# Patient Record
Sex: Male | Born: 1980 | Race: White | Hispanic: No | Marital: Married | Smoking: Former smoker
Health system: Southern US, Community
[De-identification: ages and names within clinical notes are randomized; demographics above are authoritative.]

## PROBLEM LIST (undated history)

## (undated) DIAGNOSIS — G5600 Carpal tunnel syndrome, unspecified upper limb: Secondary | ICD-10-CM

## (undated) DIAGNOSIS — K4090 Unilateral inguinal hernia, without obstruction or gangrene, not specified as recurrent: Secondary | ICD-10-CM

## (undated) DIAGNOSIS — B009 Herpesviral infection, unspecified: Secondary | ICD-10-CM

## (undated) DIAGNOSIS — M109 Gout, unspecified: Secondary | ICD-10-CM

## (undated) HISTORY — PX: CARPAL TUNNEL RELEASE: SHX101

## (undated) HISTORY — DX: Carpal tunnel syndrome, unspecified upper limb: G56.00

## (undated) HISTORY — PX: HERNIA REPAIR: SHX51

## (undated) HISTORY — PX: TOE SURGERY: SHX1073

## (undated) HISTORY — PX: OTHER SURGICAL HISTORY: SHX169

## (undated) HISTORY — DX: Herpesviral infection, unspecified: B00.9

---
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
## 2001-01-06 ENCOUNTER — Emergency Department (HOSPITAL_COMMUNITY): Admission: EM | Admit: 2001-01-06 | Discharge: 2001-01-06 | Payer: Self-pay | Admitting: Emergency Medicine

## 2005-02-13 ENCOUNTER — Emergency Department (HOSPITAL_COMMUNITY): Admission: EM | Admit: 2005-02-13 | Discharge: 2005-02-13 | Payer: Self-pay | Admitting: Emergency Medicine

## 2010-11-08 ENCOUNTER — Ambulatory Visit (HOSPITAL_COMMUNITY)
Admission: RE | Admit: 2010-11-08 | Discharge: 2010-11-08 | Disposition: A | Discharge status: Home / Self Care | Payer: BC Managed Care – PPO | Source: Ambulatory Visit | Attending: Orthopedic Surgery | Admitting: Orthopedic Surgery

## 2010-11-08 ENCOUNTER — Other Ambulatory Visit (HOSPITAL_COMMUNITY): Payer: Self-pay | Admitting: Orthopedic Surgery

## 2010-11-08 DIAGNOSIS — M79602 Pain in left arm: Secondary | ICD-10-CM

## 2010-11-08 DIAGNOSIS — Z1389 Encounter for screening for other disorder: Secondary | ICD-10-CM | POA: Insufficient documentation

## 2011-05-06 ENCOUNTER — Encounter (INDEPENDENT_AMBULATORY_CARE_PROVIDER_SITE_OTHER): Payer: Self-pay | Admitting: General Surgery

## 2011-05-09 ENCOUNTER — Ambulatory Visit (INDEPENDENT_AMBULATORY_CARE_PROVIDER_SITE_OTHER): Payer: BC Managed Care – PPO | Admitting: General Surgery

## 2011-05-20 ENCOUNTER — Encounter (INDEPENDENT_AMBULATORY_CARE_PROVIDER_SITE_OTHER): Payer: Self-pay | Admitting: General Surgery

## 2011-06-13 ENCOUNTER — Ambulatory Visit (INDEPENDENT_AMBULATORY_CARE_PROVIDER_SITE_OTHER): Payer: BC Managed Care – PPO | Admitting: General Surgery

## 2011-06-29 ENCOUNTER — Encounter (INDEPENDENT_AMBULATORY_CARE_PROVIDER_SITE_OTHER): Payer: Self-pay | Admitting: General Surgery

## 2011-11-15 ENCOUNTER — Emergency Department (HOSPITAL_COMMUNITY): Payer: BC Managed Care – PPO

## 2011-11-15 ENCOUNTER — Emergency Department (HOSPITAL_COMMUNITY)
Admission: EM | Admit: 2011-11-15 | Discharge: 2011-11-15 | Disposition: A | Discharge status: Home / Self Care | Payer: BC Managed Care – PPO | Attending: Emergency Medicine | Admitting: Emergency Medicine

## 2011-11-15 ENCOUNTER — Encounter (HOSPITAL_COMMUNITY): Payer: Self-pay | Admitting: *Deleted

## 2011-11-15 DIAGNOSIS — M25469 Effusion, unspecified knee: Secondary | ICD-10-CM | POA: Insufficient documentation

## 2011-11-15 DIAGNOSIS — M25561 Pain in right knee: Secondary | ICD-10-CM

## 2011-11-15 MED ORDER — HYDROCODONE-ACETAMINOPHEN 5-500 MG PO TABS: 1.0000 | ORAL_TABLET | Freq: Four times a day (QID) | ORAL | Status: AC | PRN

## 2011-11-15 NOTE — ED Provider Notes (Signed)
History     CSN: [id number]  Arrival date & time 11/15/11  0343   First MD Initiated Contact with Patient 11/15/11 [phone]      Chief Complaint  Patient presents with  . Knee Pain    (Consider location/radiation/quality/duration/timing/severity/associated sxs/prior treatment) HPI Comments: The patient started with right knee pain 2 days ago without a history of trauma.  He works Development worker, community down frequently.   Patient is a 31 y.o. male presenting with knee pain. The history is provided by the patient.  Knee Pain This is a new problem. The current episode started 2 days ago. The problem occurs constantly. The problem has been gradually worsening. The symptoms are aggravated by walking. Nothing relieves the symptoms. He has tried nothing for the symptoms.    History reviewed. No pertinent past medical history.  History reviewed. No pertinent past surgical history.  No family history on file.  History  Substance Use Topics  . Smoking status: Never Smoker   . Smokeless tobacco: Not on file  . Alcohol Use: Yes      Review of Systems  All other systems reviewed and are negative.    Allergies  Review of patient's allergies indicates no known allergies.  Home Medications  No current outpatient prescriptions on file.  BP 132/77  Pulse 105  Temp 98.4 F (36.9 C) (Oral)  Resp 20  SpO2 98%  Physical Exam  Nursing note and vitals reviewed. Constitutional: He is oriented to person, place, and time. He appears well-developed and well-nourished. No distress.  HENT:  Head: Normocephalic and atraumatic.  Neck: Normal range of motion. Neck supple.  Musculoskeletal:       There is a palpable effusion in the right knee.  There is pain with range of motion.  There is no erythema or warmth.  Stable ap, laterally.  Neurological: He is alert and oriented to person, place, and time.  Skin: Skin is warm and dry. He is not diaphoretic.    ED Course  Procedures  (including critical care time)  Labs Reviewed - No data to display Dg Knee Complete 4 Views Right  11/15/2011  *RADIOLOGY REPORT*  Clinical Data: Knee pain and cramping.  RIGHT KNEE - COMPLETE 4+ VIEW  Comparison: None.  Findings: Moderate sized right knee effusion.  No evidence of acute fracture or subluxation.  No focal bone lesion or bone destruction. Bone cortex and trabecular architecture appear intact. No radiopaque soft tissue foreign bodies.  IMPRESSION: Moderate sized right knee effusion.  No acute bony abnormalities.  Original Report Authenticated By: Marlon Pel, M.D.     No diagnosis found.    MDM  Looks like an effusion due to inflammation from repeated movements.  Will discharge with rest, time, to follow up with his pcp if not improving in the next 1-2 weeks.        Geoffery Lyons, MD 11/15/11 [phone]

## 2011-11-15 NOTE — ED Notes (Signed)
Ortho tech to bedside and applied ace wrap and gave crutches

## 2011-11-15 NOTE — Progress Notes (Signed)
Orthopedic Tech Progress Note Patient Details:  Zachary Mason [birth year] [id number]  Ortho Devices Type of Ortho Device: Crutches Ortho Device/Splint Interventions: Application   Asia Burnett Kanaris 11/15/2011, 5:43 AM

## 2011-11-15 NOTE — ED Notes (Addendum)
C/o R knee pain, 8/10, felt a cramping 2 weeks ago, but noticed pain and swelling 2d ago. Swelling noted. No known injury. CMS intact BLE. Denies fever.

## 2012-02-03 ENCOUNTER — Ambulatory Visit: Payer: BC Managed Care – PPO | Admitting: Internal Medicine

## 2012-02-03 DIAGNOSIS — Z0289 Encounter for other administrative examinations: Secondary | ICD-10-CM

## 2012-02-29 ENCOUNTER — Emergency Department (HOSPITAL_COMMUNITY)
Admission: EM | Admit: 2012-02-29 | Discharge: 2012-02-29 | Disposition: A | Discharge status: Home / Self Care | Payer: BC Managed Care – PPO | Attending: Emergency Medicine | Admitting: Emergency Medicine

## 2012-02-29 ENCOUNTER — Emergency Department (HOSPITAL_COMMUNITY): Payer: BC Managed Care – PPO

## 2012-02-29 ENCOUNTER — Encounter (HOSPITAL_COMMUNITY): Payer: Self-pay | Admitting: Emergency Medicine

## 2012-02-29 DIAGNOSIS — K4090 Unilateral inguinal hernia, without obstruction or gangrene, not specified as recurrent: Secondary | ICD-10-CM | POA: Insufficient documentation

## 2012-02-29 DIAGNOSIS — Z862 Personal history of diseases of the blood and blood-forming organs and certain disorders involving the immune mechanism: Secondary | ICD-10-CM | POA: Insufficient documentation

## 2012-02-29 DIAGNOSIS — Z8639 Personal history of other endocrine, nutritional and metabolic disease: Secondary | ICD-10-CM | POA: Insufficient documentation

## 2012-02-29 DIAGNOSIS — R109 Unspecified abdominal pain: Secondary | ICD-10-CM | POA: Insufficient documentation

## 2012-02-29 HISTORY — DX: Unilateral inguinal hernia, without obstruction or gangrene, not specified as recurrent: K40.90

## 2012-02-29 HISTORY — DX: Gout, unspecified: M10.9

## 2012-02-29 MED ORDER — IBUPROFEN 600 MG PO TABS: 600.0000 mg | ORAL_TABLET | Freq: Four times a day (QID) | ORAL | Status: DC | PRN

## 2012-02-29 MED ORDER — TRAMADOL HCL 50 MG PO TABS: 50.0000 mg | ORAL_TABLET | Freq: Four times a day (QID) | ORAL | Status: DC | PRN

## 2012-02-29 MED ORDER — IBUPROFEN 800 MG PO TABS
800.0000 mg | ORAL_TABLET | Freq: Once | ORAL | Status: AC
  Administered 2012-02-29: 800 mg via ORAL
  Filled 2012-02-29: qty 1

## 2012-02-29 NOTE — ED Provider Notes (Signed)
Medical screening examination/treatment/procedure(s) were performed by non-physician practitioner and as supervising physician I was immediately available for consultation/collaboration.  Gerhard Munch, MD 02/29/12 [phone]

## 2012-02-29 NOTE — ED Notes (Signed)
Pt sts hx of right inguinal hernia that is painful at present; pt denies feeling it being more firm than normal

## 2012-02-29 NOTE — ED Provider Notes (Signed)
History     CSN: [id number]  Arrival date & time 02/29/12  1059   First MD Initiated Contact with Patient 02/29/12 1149      Chief Complaint  Patient presents with  . Hernia    (Consider location/radiation/quality/duration/timing/severity/associated sxs/prior treatment) HPI Comments: Zachary Mason is a 31 y.o. Male who presents with complaint of a right inguinal hernia. State hernia there for "years." states more painful in the last several days. Stats pain is worse with straining and heavy lifting. Denies n/v fever. Normal bowel movement this morning. No urinary symptoms. No other complaints. Stats had it looked at once a long time ago, states has not followed up since.    Past Medical History  Diagnosis Date  . Gout   . Inguinal hernia     History reviewed. No pertinent past surgical history.  History reviewed. No pertinent family history.  History  Substance Use Topics  . Smoking status: Never Smoker   . Smokeless tobacco: Not on file  . Alcohol Use: Yes      Review of Systems  Constitutional: Negative for fever and chills.  HENT: Negative for neck pain and neck stiffness.   Respiratory: Negative.   Cardiovascular: Negative.   Gastrointestinal: Positive for abdominal pain. Negative for nausea, vomiting, diarrhea, constipation and blood in stool.  Genitourinary: Negative for dysuria and flank pain.  Musculoskeletal: Negative.   Skin: Negative.   Neurological: Negative for dizziness and weakness.  Psychiatric/Behavioral: Negative.     Allergies  Review of patient's allergies indicates no known allergies.  Home Medications  No current outpatient prescriptions on file.  BP 124/82  Pulse 61  Temp 97.6 F (36.4 C) (Oral)  Resp 18  SpO2 100%  Physical Exam  Nursing note and vitals reviewed. Constitutional: He appears well-developed and well-nourished. No distress.  HENT:  Head: Normocephalic and atraumatic.  Eyes: Conjunctivae normal are normal.    Cardiovascular: Normal rate, regular rhythm and normal heart sounds.   Pulmonary/Chest: Effort normal and breath sounds normal. No respiratory distress. He has no wheezes. He has no rales.  Abdominal: Soft. Bowel sounds are normal. He exhibits no distension. There is no tenderness. There is no rebound.       About 8cm soft, reducible right inguinal direct hernia. Non tender.   Musculoskeletal: He exhibits no edema.  Neurological: He is alert.  Skin: Skin is warm and dry.    ED Course  Procedures (including critical care time)  No results found for this or any previous visit. Dg Abd 2 Views  02/29/2012  *RADIOLOGY REPORT*  Clinical Data: Right inguinal hernia with worsening pain  ABDOMEN - 2 VIEW  Comparison: None.  Findings: Bowel gas pattern is normal without evidence of ileus, obstruction or free air.  No abnormal calcifications or bony findings.  IMPRESSION: Normal radiographs.   Original Report Authenticated By: Thomasenia Sales, M.D.       1. Right inguinal hernia       MDM  Reducible, right inguinal hernia. No signs of obstruction, incarceration, strangulation. Pt has no n/v. Normal BM today. Pt will be referred to surgery for further treatment.   Filed Vitals:   02/29/12 1321  BP: 138/95  Pulse: 64  Temp:   Resp: 16           Loranda Mastel A Tabytha Gradillas, PA 02/29/12 1535

## 2012-03-13 ENCOUNTER — Ambulatory Visit (INDEPENDENT_AMBULATORY_CARE_PROVIDER_SITE_OTHER): Payer: BC Managed Care – PPO | Admitting: Surgery

## 2012-03-13 ENCOUNTER — Encounter (INDEPENDENT_AMBULATORY_CARE_PROVIDER_SITE_OTHER): Payer: Self-pay | Admitting: Surgery

## 2012-03-13 VITALS — BP 142/70 | HR 68 | Temp 97.5°F | Resp 20 | Ht 68.0 in | Wt 184.2 lb

## 2012-03-13 DIAGNOSIS — K4090 Unilateral inguinal hernia, without obstruction or gangrene, not specified as recurrent: Secondary | ICD-10-CM

## 2012-03-13 NOTE — Progress Notes (Signed)
Patient ID: Zachary Mason, male   DOB: Nov 16, 1980, 31 y.o.   MRN: [id number]  Chief Complaint  Patient presents with  . New Evaluation    eval RIH    HPI Zachary Mason is a 31 y.o. male.  Referred by Dr. Jeraldine Loots - ED HPI This is a 31 year old male who presents with a history over the last year of an enlarging bulge in his right groin. Recently this has become very uncomfortable. He remains reducible. The pain radiates across his lower pelvis towards the left side. Sometimes it goes down into his testicle as well as into his thigh. Sometimes his pain is quite severe.  Past Medical History  Diagnosis Date  . Gout   . Inguinal hernia     History reviewed. No pertinent past surgical history.  Family History  Problem Relation Age of Onset  . Cancer Maternal Grandmother     breast    Social History History  Substance Use Topics  . Smoking status: Former Games developer  . Smokeless tobacco: Never Used  . Alcohol Use: No    No Known Allergies  Current Outpatient Prescriptions  Medication Sig Dispense Refill  . ibuprofen (ADVIL,MOTRIN) 600 MG tablet Take 1 tablet (600 mg total) by mouth every 6 (six) hours as needed for pain.  30 tablet  0  . traMADol (ULTRAM) 50 MG tablet Take 1 tablet (50 mg total) by mouth every 6 (six) hours as needed for pain.  15 tablet  0    Review of Systems Review of Systems  Constitutional: Negative for fever, chills and unexpected weight change.  HENT: Negative for hearing loss, congestion, sore throat, trouble swallowing and voice change.   Eyes: Negative for visual disturbance.  Respiratory: Negative for cough and wheezing.   Cardiovascular: Negative for chest pain, palpitations and leg swelling.  Gastrointestinal: Positive for abdominal pain. Negative for nausea, vomiting, diarrhea, constipation, blood in stool, abdominal distention, anal bleeding and rectal pain.  Genitourinary: Negative for hematuria and difficulty urinating.  Musculoskeletal:  Negative for arthralgias.  Skin: Negative for rash and wound.  Neurological: Negative for seizures, syncope, weakness and headaches.  Hematological: Negative for adenopathy. Does not bruise/bleed easily.  Psychiatric/Behavioral: Negative for confusion.    Blood pressure 142/70, pulse 68, temperature 97.5 F (36.4 C), temperature source Temporal, resp. rate 20, height 5\' 8"  (1.727 m), weight 184 lb 3.2 oz (83.553 kg).  Physical Exam Physical Exam WDWN in NAD HEENT:  EOMI, sclera anicteric Neck:  No masses, no thyromegaly Lungs:  CTA bilaterally; normal respiratory effort CV:  Regular rate and rhythm; no murmurs Abd:  +bowel sounds, soft, non-tender, no masses GU:  Bilateral descended testes; no testicular masses; visible right inguinal bulge; no sign of left inguinal hernia Ext:  Well-perfused; no edema Skin:  Warm, dry; no sign of jaundice  Data Reviewed none  Assessment    Right inguinal hernia  - reducible    Plan    Right inguinal hernia repair with mesh.  The surgical procedure has been discussed with the patient.  Potential risks, benefits, alternative treatments, and expected outcomes have been explained.  All of the patient's questions at this time have been answered.  The likelihood of reaching the patient's treatment goal is good.  The patient understand the proposed surgical procedure and wishes to proceed.        Lamario Mani K. 03/13/2012, 3:53 PM

## 2012-04-04 DIAGNOSIS — K4090 Unilateral inguinal hernia, without obstruction or gangrene, not specified as recurrent: Secondary | ICD-10-CM

## 2012-04-05 ENCOUNTER — Telehealth (INDEPENDENT_AMBULATORY_CARE_PROVIDER_SITE_OTHER): Payer: Self-pay | Admitting: General Surgery

## 2012-04-05 NOTE — Telephone Encounter (Signed)
Pt called to report unresolved post op pain; could not rest at all.  Offered suggestions to help his achieve better pain control, but taking 2 Oxycodone Q4H x 12-24 hours.  Also to take Aleve 2 pills Q12H.  Reminded him to drink extra fluids, walk some and try taking a stool softener, as the pain meds can be constipating.  Use an ice pack to the surgical site when sitting.  He will try all this and call back if not better.

## 2012-04-09 ENCOUNTER — Telehealth (INDEPENDENT_AMBULATORY_CARE_PROVIDER_SITE_OTHER): Payer: Self-pay | Admitting: General Surgery

## 2012-04-09 NOTE — Telephone Encounter (Signed)
Pt called for refill of pain meds; informed he will get Vicodin this time.  Called Hydrocodone 5/325 mg,  # 30, 1-2 po Q 4-6 H prn pain, no refill to [facility]:  [phone].

## 2012-04-19 ENCOUNTER — Encounter (INDEPENDENT_AMBULATORY_CARE_PROVIDER_SITE_OTHER): Payer: BC Managed Care – PPO | Admitting: Surgery

## 2012-05-08 ENCOUNTER — Encounter (INDEPENDENT_AMBULATORY_CARE_PROVIDER_SITE_OTHER): Payer: Self-pay | Admitting: Surgery

## 2013-09-20 ENCOUNTER — Encounter: Payer: Self-pay | Admitting: Podiatry

## 2013-09-20 ENCOUNTER — Ambulatory Visit (INDEPENDENT_AMBULATORY_CARE_PROVIDER_SITE_OTHER): Payer: BC Managed Care – PPO | Admitting: Podiatry

## 2013-09-20 VITALS — BP 119/80 | HR 55 | Resp 18

## 2013-09-20 DIAGNOSIS — M2040 Other hammer toe(s) (acquired), unspecified foot: Secondary | ICD-10-CM

## 2013-09-20 DIAGNOSIS — L84 Corns and callosities: Secondary | ICD-10-CM

## 2013-09-20 NOTE — Progress Notes (Signed)
   Subjective:    Patient ID: Zachary Mason, male    DOB: January 05, 1981, 33 y.o.   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  Patient works as an Journalist, newspaper for multiple years  Review of Systems  Constitutional: Positive for activity change and fatigue.  Musculoskeletal: Positive for gait problem.  Neurological: Positive for numbness.       Hands and feet  Psychiatric/Behavioral: Positive for behavioral problems and confusion. The patient is nervous/anxious.   All other systems reviewed and are negative.      Objective:   Physical Exam  Orientated x3 white male  Vascular: DP 0/4 bilaterally PTs 2/4 bilaterally Capillary fill is immediate bilaterally Feet are warm to touch bilaterally  Neurological: Sensation to 10 g monofilament wire intact 5/5 bilaterally Ankle reflexes reactive bilaterally Vibratory sensation intact bilaterally  Dermatological: Keratoses noted distal medial fifth right toe (this is the area of maximum tenderness to palpation) Minimal keratoses lateral border fourth right toe  Musculoskeletal: Adductovarus fifth digits bilaterally      Assessment & Plan:   Assessment: Hammertoe fifth digit bilaterally (adductovarus deformities) Keratoses distal medial fifth right toe associated with varus deformity of fifth toe  Plan: Advised patient that the symptoms are related to the friction rub between the fourth and fifth toes when wearing closed shoes. As he said a short history and no conservative care I am advising conservative care at this time. The keratoses in the fifth right toe was debrided and packed with salinocaine  Upon removal of dressing fifth right toe I recommended  a gelcap sleeve to place over the fourth and fifth toe were dispensed a gelcap sleeve. Advised patient if symptoms were persistent to return. Made patient aware that if conservative care would not adequately resolve this problem surgical treatment could be an option.

## 2013-09-20 NOTE — Patient Instructions (Signed)

## 2013-09-21 ENCOUNTER — Encounter: Payer: Self-pay | Admitting: Podiatry

## 2014-07-11 ENCOUNTER — Ambulatory Visit: Payer: Self-pay | Admitting: Specialist

## 2014-08-31 NOTE — Op Note (Signed)
PATIENT NAME:  Zachary Mason, DISANO MR#:  [id number] DATE OF BIRTH:  December 19, 1980  DATE OF PROCEDURE:  07/11/2014  PREOPERATIVE DIAGNOSES: 1.  Left carpal tunnel syndrome.  2.  Painful callus, right little toe.   POSTOPERATIVE DIAGNOSES: 1.  Left carpal tunnel syndrome.  2.  Painful callus, right little toe.   PROCEDURES: 1.  Left carpal tunnel release.  2.  Condylectomy distal phalanx, right little toe.   SURGEON: Clare Gandy, M.D.   ANESTHESIA: General.   COMPLICATIONS: None.   TOURNIQUET TIME: Eighteen minutes for the left hand and 12 minutes for the right foot.   DESCRIPTION OF PROCEDURE: Two grams of Ancef were given intravenously prior to the procedure. General anesthesia was induced. The left upper extremity and the right foot were thoroughly prepped out with alcohol and ChloraPrep and draped in standard sterile fashion. At the left upper extremity, the extremity was wrapped out with the Esmarch bandage and pneumatic tourniquet elevated to 250 mmHg.  Under loupe magnification, standard volar carpal tunnel incision was made and the dissection carried down to the transverse  retinacular ligament. Incision was made in the midportion. The distal release was performed with the small scissors and the proximal release was performed with the small scissors and the carpal tunnel scissors. There was seen to be moderate compression of the median nerve directly beneath the ligament. Careful check was made both proximally and distally to ensure that complete release had been obtained. The wound was thoroughly irrigated multiple times. Skin edges were infiltrated with 0.5% plain Marcaine. The skin was closed with 4-0 nylon. A soft bulky dressing was applied and the tourniquet was released. At the right foot, the right foot and toes were thoroughly prepped with alcohol and ChloraPrep and draped in standard sterile fashion. At the medial aspect of the tip of the right little toe, there was a prominent  callus with a palpable underlying exostosis.   Elliptical excision was made excising most of the callus.  Under loupe magnification, the dissection was carried down and scar tissue and a large osteophyte was removed.  Palpation following this demonstrated no residual  protuberance. The wound was thoroughly irrigated multiple times. Digital block was  performed with 0.5% plain Xylocaine. The skin was closed with 4-0 nylon. A soft bulky dressing was applied. The tourniquet was released. The patient was returned to the recovery room in satisfactory condition having tolerated the procedure quite well.       ____________________________ Clare Gandy, MD ces:tr D: 07/11/2014 11:55:43 ET T: 07/11/2014 12:22:40 ET JOB#: [id number]  cc: Clare Gandy, MD, <Dictator> Clare Gandy MD ELECTRONICALLY SIGNED 07/12/2014 10:07

## 2014-11-12 ENCOUNTER — Emergency Department (HOSPITAL_COMMUNITY): Payer: No Typology Code available for payment source

## 2014-11-12 ENCOUNTER — Emergency Department (HOSPITAL_COMMUNITY)
Admission: EM | Admit: 2014-11-12 | Discharge: 2014-11-12 | Disposition: A | Discharge status: Home / Self Care | Payer: No Typology Code available for payment source | Attending: Emergency Medicine | Admitting: Emergency Medicine

## 2014-11-12 ENCOUNTER — Encounter (HOSPITAL_COMMUNITY): Payer: Self-pay | Admitting: Family Medicine

## 2014-11-12 DIAGNOSIS — M109 Gout, unspecified: Secondary | ICD-10-CM | POA: Insufficient documentation

## 2014-11-12 DIAGNOSIS — S29001A Unspecified injury of muscle and tendon of front wall of thorax, initial encounter: Secondary | ICD-10-CM | POA: Diagnosis present

## 2014-11-12 DIAGNOSIS — R0781 Pleurodynia: Secondary | ICD-10-CM

## 2014-11-12 DIAGNOSIS — Y998 Other external cause status: Secondary | ICD-10-CM | POA: Insufficient documentation

## 2014-11-12 DIAGNOSIS — Y9289 Other specified places as the place of occurrence of the external cause: Secondary | ICD-10-CM | POA: Insufficient documentation

## 2014-11-12 DIAGNOSIS — Z8719 Personal history of other diseases of the digestive system: Secondary | ICD-10-CM | POA: Insufficient documentation

## 2014-11-12 DIAGNOSIS — Z87891 Personal history of nicotine dependence: Secondary | ICD-10-CM | POA: Insufficient documentation

## 2014-11-12 DIAGNOSIS — S29011A Strain of muscle and tendon of front wall of thorax, initial encounter: Secondary | ICD-10-CM | POA: Insufficient documentation

## 2014-11-12 DIAGNOSIS — X58XXXA Exposure to other specified factors, initial encounter: Secondary | ICD-10-CM | POA: Insufficient documentation

## 2014-11-12 DIAGNOSIS — Y939 Activity, unspecified: Secondary | ICD-10-CM | POA: Diagnosis not present

## 2014-11-12 MED ORDER — NAPROXEN 500 MG PO TABS: 500.0000 mg | ORAL_TABLET | Freq: Two times a day (BID) | ORAL | Status: DC

## 2014-11-12 MED ORDER — KETOROLAC TROMETHAMINE 60 MG/2ML IM SOLN
60.0000 mg | Freq: Once | INTRAMUSCULAR | Status: AC
  Administered 2014-11-12: 60 mg via INTRAMUSCULAR
  Filled 2014-11-12: qty 2

## 2014-11-12 NOTE — Discharge Instructions (Signed)
Ice. Naprosyn for pain. Follow up with primary care doctor as needed. Your xray is normal today.    Chest Wall Pain Chest wall pain is pain in or around the bones and muscles of your chest. It may take up to 6 weeks to get better. It may take longer if you must stay physically active in your work and activities.  CAUSES  Chest wall pain may happen on its own. However, it may be caused by:  A viral illness like the flu.  Injury.  Coughing.  Exercise.  Arthritis.  Fibromyalgia.  Shingles. HOME CARE INSTRUCTIONS   Avoid overtiring physical activity. Try not to strain or perform activities that cause pain. This includes any activities using your chest or your abdominal and side muscles, especially if heavy weights are used.  Put ice on the sore area.  Put ice in a plastic bag.  Place a towel between your skin and the bag.  Leave the ice on for 15-20 minutes per hour while awake for the first 2 days.  Only take over-the-counter or prescription medicines for pain, discomfort, or fever as directed by your caregiver. SEEK IMMEDIATE MEDICAL CARE IF:   Your pain increases, or you are very uncomfortable.  You have a fever.  Your chest pain becomes worse.  You have new, unexplained symptoms.  You have nausea or vomiting.  You feel sweaty or lightheaded.  You have a cough with phlegm (sputum), or you cough up blood. MAKE SURE YOU:   Understand these instructions.  Will watch your condition.  Will get help right away if you are not doing well or get worse. Document Released: 04/18/2005 Document Revised: 07/11/2011 Document Reviewed: 12/13/2010 [facility] Patient Information 2015 [place], [place]. This information is not intended to replace advice given to you by your health care provider. Make sure you discuss any questions you have with your health care provider.

## 2014-11-12 NOTE — ED Provider Notes (Signed)
CSN: [id number]     Arrival date & time 11/12/14  1448 History  This chart was scribed for non-physician practitioner, Lottie Mussel, PA-C, working with Purvis Sheffield, MD by Charline Bills, ED Scribe. This patient was seen in room TR10C/TR10C and the patient's care was started at 3:56 PM.   Chief Complaint  Patient presents with  . Rib Injury   The history is provided by the patient. No language interpreter was used.   HPI Comments: Zachary Mason is a 34 y.o. male who presents to the Emergency Department complaining of left rib injury that occurred yesterday. Pt states that he was doing leg presses in the gym last night when he felt a pop in his left ribs. He reports associated non-radiating pain that is exacerbated with inhaling, palpation and movement. No treatments tried PTA. Pt denies SOB. Pt is a smoker and reports heavy lifting at work.   Past Medical History  Diagnosis Date  . Gout   . Inguinal hernia    Past Surgical History  Procedure Laterality Date  . Hernia repair     Family History  Problem Relation Age of Onset  . Cancer Maternal Grandmother     breast   History  Substance Use Topics  . Smoking status: Former Games developer  . Smokeless tobacco: Never Used  . Alcohol Use: Yes    Review of Systems  Respiratory: Negative for shortness of breath.   Musculoskeletal:       + Rib pain   Allergies  Review of patient's allergies indicates no known allergies.  Home Medications   Prior to Admission medications   Medication Sig Start Date End Date Taking? Authorizing Provider  ibuprofen (ADVIL,MOTRIN) 600 MG tablet Take 1 tablet (600 mg total) by mouth every 6 (six) hours as needed for pain. 02/29/12   Denijah Karrer, PA-C  traMADol (ULTRAM) 50 MG tablet Take 1 tablet (50 mg total) by mouth every 6 (six) hours as needed for pain. 02/29/12   Valynn Schamberger, PA-C   BP 133/80 mmHg  Pulse 78  Temp(Src) 98 F (36.7 C)  Resp 18  SpO2 97% Physical Exam   Constitutional: He is oriented to person, place, and time. He appears well-developed and well-nourished. No distress.  HENT:  Head: Normocephalic and atraumatic.  Eyes: Conjunctivae and EOM are normal.  Neck: Neck supple. No tracheal deviation present.  Cardiovascular: Normal rate, regular rhythm and normal heart sounds.   Pulmonary/Chest: Effort normal and breath sounds normal. No respiratory distress. He has no wheezes. He has no rales. He exhibits tenderness.  Tenderness to palpation over left lateral ribs, mainly over ribs 9 and 10 and intercostal spaces.  Abdominal: Soft. Bowel sounds are normal. There is no tenderness.  Musculoskeletal: Normal range of motion.  Neurological: He is alert and oriented to person, place, and time.  Skin: Skin is warm and dry.  Psychiatric: He has a normal mood and affect. His behavior is normal.  Nursing note and vitals reviewed.  ED Course  Procedures (including critical care time) DIAGNOSTIC STUDIES: Oxygen Saturation is 97% on RA, normal by my interpretation.    COORDINATION OF CARE: 3:59 PM-Discussed treatment plan which includes CXR with pt at bedside and pt agreed to plan.   Labs Review Labs Reviewed - No data to display  Imaging Review Dg Ribs Unilateral W/chest Left  11/12/2014   CLINICAL DATA:  Left side rib pain, injury at gym yesterday  EXAM: LEFT RIBS AND CHEST - 3+ VIEW  COMPARISON:  None.  FINDINGS: Four views left ribs submitted. No acute infiltrate or pulmonary edema. No left rib fracture. No pneumothorax.  IMPRESSION: Negative.   Electronically Signed   By: Natasha Mead M.D.   On: 11/12/2014 15:51    EKG Interpretation None      MDM   Final diagnoses:  Intercostal muscle strain, initial encounter    Patient with left rib pain after doing a lot depressed and possibly hitting himself in the ribs. X-rays negative. No deformity. Lungs are clear. Vital signs are normal. Most likely rib contusion versus intercostal muscle strain.  Plan to discharge home with naproxen, ice, rest. Follow up as needed.  Filed Vitals:   11/12/14 1507  BP: 133/80  Pulse: 78  Temp: 98 F (36.7 C)  Resp: 18  SpO2: 97%    I personally performed the services described in this documentation, which was scribed in my presence. The recorded information has been reviewed and is accurate.   Jaynie Crumble, PA-C 11/12/14 1615  Purvis Sheffield, MD 11/14/14 [phone]

## 2014-11-12 NOTE — ED Notes (Signed)
p here for left rib pain after doing leg presses at the gym yesterday. sts he felt a pop in left rib area.

## 2014-12-26 ENCOUNTER — Encounter: Payer: Self-pay | Admitting: Behavioral Health

## 2014-12-26 ENCOUNTER — Telehealth: Payer: Self-pay | Admitting: Behavioral Health

## 2014-12-26 NOTE — Telephone Encounter (Signed)
Pre-Visit Call completed with patient and chart updated.   Pre-Visit Info documented in Specialty Comments under [employer].    

## 2014-12-29 ENCOUNTER — Ambulatory Visit (INDEPENDENT_AMBULATORY_CARE_PROVIDER_SITE_OTHER): Payer: No Typology Code available for payment source | Admitting: Physician Assistant

## 2014-12-29 ENCOUNTER — Ambulatory Visit: Payer: Self-pay | Admitting: Physician Assistant

## 2014-12-29 ENCOUNTER — Encounter: Payer: Self-pay | Admitting: Physician Assistant

## 2014-12-29 VITALS — BP 132/88 | HR 64 | Temp 97.9°F | Resp 16 | Ht 67.0 in | Wt 189.1 lb

## 2014-12-29 DIAGNOSIS — F418 Other specified anxiety disorders: Secondary | ICD-10-CM

## 2014-12-29 DIAGNOSIS — F419 Anxiety disorder, unspecified: Principal | ICD-10-CM

## 2014-12-29 DIAGNOSIS — Z202 Contact with and (suspected) exposure to infections with a predominantly sexual mode of transmission: Secondary | ICD-10-CM | POA: Diagnosis not present

## 2014-12-29 DIAGNOSIS — Z87891 Personal history of nicotine dependence: Secondary | ICD-10-CM | POA: Insufficient documentation

## 2014-12-29 DIAGNOSIS — F32A Depression, unspecified: Secondary | ICD-10-CM | POA: Insufficient documentation

## 2014-12-29 DIAGNOSIS — A6000 Herpesviral infection of urogenital system, unspecified: Secondary | ICD-10-CM | POA: Diagnosis not present

## 2014-12-29 DIAGNOSIS — Z720 Tobacco use: Secondary | ICD-10-CM

## 2014-12-29 DIAGNOSIS — F329 Major depressive disorder, single episode, unspecified: Secondary | ICD-10-CM | POA: Insufficient documentation

## 2014-12-29 MED ORDER — VALACYCLOVIR HCL 500 MG PO TABS: ORAL_TABLET | ORAL | Status: DC

## 2014-12-29 MED ORDER — NICOTINE 21 MG/24HR TD PT24: 21.0000 mg | MEDICATED_PATCH | Freq: Every day | TRANSDERMAL | Status: DC

## 2014-12-29 MED ORDER — ESCITALOPRAM OXALATE 10 MG PO TABS: 10.0000 mg | ORAL_TABLET | Freq: Every day | ORAL | Status: DC

## 2014-12-29 MED ORDER — VALACYCLOVIR HCL 1 G PO TABS: 1000.0000 mg | ORAL_TABLET | Freq: Every day | ORAL | Status: DC

## 2014-12-29 NOTE — Assessment & Plan Note (Signed)
15-pack-year smoking history. Discussed with patient the Chantix is not a suitable option at present due to labile mood. We'll begin NicoDerm patches. Follow-up in one month.

## 2014-12-29 NOTE — Patient Instructions (Signed)
Please go to the lab for blood work. I will call you with your results.  Please start the Lexapro taking 1/2 tablet daily x 3 days. Then increase to 1 tablet daily. Give some thought to our counseling services. I think it would be very beneficial.  Please take the 500 mg tablet of Valtrex as directed for three days. Then begin the 1000 mg tablet daily for prevention. 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 Please use the Nicoderm patch as directed.  Follow-up in 4 weeks. If anything worsens, return immediately.  It was nice meeting you. Welcome to [facility].

## 2014-12-29 NOTE — Progress Notes (Signed)
Pre visit review using our clinic review tool, if applicable. No additional management support is needed unless otherwise documented below in the visit note/SLS  

## 2014-12-29 NOTE — Progress Notes (Signed)
Patient presents to clinic today to establish care.  Acute Concerns: Patient endorses generalized anxiety with severe irritability and labile mood. Also endorses depressed mood and anhedonia. Symptoms are affecting sleep. Patient has had a hard time coping with his HSV diagnosis. Has never been treated for anxiety or depression previously.  Chronic Issues: Patient with prior diagnosis of HSV 2 one year ago, obtained from his wife who he states was unfaithful. Patient with recurrent outbreaks. Endorses outbreak currently starting a day ago. Denies pain but lesions are present. Has previously been on Valtrex with good results. Is currently a 1 pack per day smoker. Is requesting routine STD panel as he states this was never checked after he tested positive for HSV. Denies penile pain or discharge. Denies unexplainable fevers changes in weight.    Past Medical History  Diagnosis Date  . Gout   . Inguinal hernia   . CTS (carpal tunnel syndrome)   . HSV-2 (herpes simplex virus 2) infection     Past Surgical History  Procedure Laterality Date  . Hernia repair    . Carpal tunnel release      Left Hand  . Bone spur      Left Foot    No current outpatient prescriptions on file prior to visit.   No current facility-administered medications on file prior to visit.    No Known Allergies  Family History  Problem Relation Age of Onset  . Breast cancer Maternal Grandmother   . Healthy Mother   . Suicidality Father     Deceased  . Hypertension Paternal Grandfather   . Hypertension Maternal Grandfather   . Diabetes Paternal Grandfather   . Diabetes Maternal Grandfather   . Heart disease Paternal Grandmother     Enlarged Heart  . Healthy Brother     x1 Half  . Healthy Sister     x2  . Healthy Daughter     x2  . Healthy Son     x1    Social History   Social History  . Marital Status: Married    Spouse Name: N/A  . Number of Children: 3  . Years of Education: N/A    Occupational History  . Not on file.   Social History Main Topics  . Smoking status: Current Every Day Smoker -- 1.00 packs/day for 15 years    Types: Cigarettes  . Smokeless tobacco: Never Used  . Alcohol Use: 0.6 oz/week    1 Standard drinks or equivalent per week  . Drug Use: Yes     Comment: marijuana -- every day use  . Sexual Activity:    Partners: Female   Other Topics Concern  . Not on file   Social History Narrative   Review of Systems  Constitutional: Negative for fever and weight loss.  HENT: Negative for hearing loss.   Respiratory: Negative for cough and shortness of breath.   Cardiovascular: Negative for chest pain and palpitations.  Neurological: Negative for dizziness, loss of consciousness and headaches.  Psychiatric/Behavioral: Positive for depression and substance abuse. Negative for suicidal ideas, hallucinations and memory loss. The patient is nervous/anxious. The patient does not have insomnia.    BP 132/88 mmHg  Pulse 64  Temp(Src) 97.9 F (36.6 C) (Oral)  Resp 16  Ht 5\' 7"  (1.702 m)  Wt 189 lb 2 oz (85.787 kg)  BMI 29.61 kg/m2  SpO2 98%  Physical Exam  Constitutional: He is oriented to person, place, and time and well-developed, well-nourished,  and in no distress.  HENT:  Head: Normocephalic and atraumatic.  Eyes: Conjunctivae are normal.  Neck: Neck supple.  Cardiovascular: Normal rate, regular rhythm, normal heart sounds and intact distal pulses.   Pulmonary/Chest: Effort normal and breath sounds normal. No respiratory distress. He has no wheezes. He has no rales. He exhibits no tenderness.  Genitourinary: Penis exhibits lesions. No discharge found.  Two lesions of HSV noted on penile shaft  Neurological: He is alert and oriented to person, place, and time.  Skin: Skin is warm and dry.  Vitals reviewed.  Assessment/Plan: Anxiety and depression Significantly affecting quality of life. We'll begin Lexapro 10 mg daily. We'll titrate up  to 20 mg as tolerated. Patient current cannabis user so will not prescribe benzodiazepine presently. Counseling recommended. Handout given on our counseling services. Patient to set up an appointment. Potential adverse reactions of medications discussed with patient. He is aware of what to do if these occur. Follow-up in 4 weeks. Return immediately if anything worsens.  Genital herpes Current mild outbreak. Rx Valtrex 500 mg twice a day 3 days. We'll then begin 1000 mg Valtrex daily for prophylaxis. Smoking cessation encouraged. Regimen started.  Possible exposure to STD With current HSV outbreak. Will obtain STI panel. Safe sex measures discussed with patient.  Tobacco abuse disorder 15-pack-year smoking history. Discussed with patient the Chantix is not a suitable option at present due to labile mood. We'll begin NicoDerm patches. Follow-up in one month.

## 2014-12-29 NOTE — Assessment & Plan Note (Signed)
With current HSV outbreak. Will obtain STI panel. Safe sex measures discussed with patient.

## 2014-12-29 NOTE — Assessment & Plan Note (Signed)
Current mild outbreak. Rx Valtrex 500 mg twice a day 3 days. We'll then begin 1000 mg Valtrex daily for prophylaxis. Smoking cessation encouraged. Regimen started.

## 2014-12-29 NOTE — Assessment & Plan Note (Signed)
Significantly affecting quality of life. We'll begin Lexapro 10 mg daily. We'll titrate up to 20 mg as tolerated. Patient current cannabis user so will not prescribe benzodiazepine presently. Counseling recommended. Handout given on our counseling services. Patient to set up an appointment. Potential adverse reactions of medications discussed with patient. He is aware of what to do if these occur. Follow-up in 4 weeks. Return immediately if anything worsens.

## 2014-12-30 LAB — GC/CHLAMYDIA PROBE AMP, URINE
CHLAMYDIA, SWAB/URINE, PCR: NEGATIVE
GC Probe Amp, Urine: NEGATIVE

## 2014-12-30 LAB — ACUTE HEP PANEL AND HEP B SURFACE AB
HCV AB: NEGATIVE
Hep A IgM: NONREACTIVE
Hep B C IgM: NONREACTIVE
Hep B S Ab: NEGATIVE
Hepatitis B Surface Ag: NEGATIVE

## 2014-12-30 LAB — HIV ANTIBODY (ROUTINE TESTING W REFLEX): HIV 1&2 Ab, 4th Generation: NONREACTIVE

## 2014-12-30 LAB — RPR

## 2015-01-01 ENCOUNTER — Encounter: Payer: Self-pay | Admitting: Physician Assistant

## 2015-03-24 ENCOUNTER — Emergency Department (HOSPITAL_BASED_OUTPATIENT_CLINIC_OR_DEPARTMENT_OTHER): Payer: No Typology Code available for payment source

## 2015-03-24 ENCOUNTER — Encounter (HOSPITAL_BASED_OUTPATIENT_CLINIC_OR_DEPARTMENT_OTHER): Payer: Self-pay | Admitting: *Deleted

## 2015-03-24 ENCOUNTER — Emergency Department (HOSPITAL_BASED_OUTPATIENT_CLINIC_OR_DEPARTMENT_OTHER)
Admission: EM | Admit: 2015-03-24 | Discharge: 2015-03-24 | Disposition: A | Discharge status: Home / Self Care | Payer: No Typology Code available for payment source | Attending: Emergency Medicine | Admitting: Emergency Medicine

## 2015-03-24 DIAGNOSIS — F419 Anxiety disorder, unspecified: Secondary | ICD-10-CM | POA: Diagnosis not present

## 2015-03-24 DIAGNOSIS — Z79899 Other long term (current) drug therapy: Secondary | ICD-10-CM | POA: Insufficient documentation

## 2015-03-24 DIAGNOSIS — S5001XA Contusion of right elbow, initial encounter: Secondary | ICD-10-CM | POA: Insufficient documentation

## 2015-03-24 DIAGNOSIS — Y998 Other external cause status: Secondary | ICD-10-CM | POA: Diagnosis not present

## 2015-03-24 DIAGNOSIS — S060X9A Concussion with loss of consciousness of unspecified duration, initial encounter: Secondary | ICD-10-CM | POA: Insufficient documentation

## 2015-03-24 DIAGNOSIS — F17210 Nicotine dependence, cigarettes, uncomplicated: Secondary | ICD-10-CM | POA: Diagnosis not present

## 2015-03-24 DIAGNOSIS — S060X1A Concussion with loss of consciousness of 30 minutes or less, initial encounter: Secondary | ICD-10-CM

## 2015-03-24 DIAGNOSIS — Z8719 Personal history of other diseases of the digestive system: Secondary | ICD-10-CM | POA: Insufficient documentation

## 2015-03-24 DIAGNOSIS — Y9289 Other specified places as the place of occurrence of the external cause: Secondary | ICD-10-CM | POA: Diagnosis not present

## 2015-03-24 DIAGNOSIS — S0003XA Contusion of scalp, initial encounter: Secondary | ICD-10-CM | POA: Diagnosis not present

## 2015-03-24 DIAGNOSIS — Z8739 Personal history of other diseases of the musculoskeletal system and connective tissue: Secondary | ICD-10-CM | POA: Diagnosis not present

## 2015-03-24 DIAGNOSIS — Z23 Encounter for immunization: Secondary | ICD-10-CM | POA: Insufficient documentation

## 2015-03-24 DIAGNOSIS — Z8619 Personal history of other infectious and parasitic diseases: Secondary | ICD-10-CM | POA: Diagnosis not present

## 2015-03-24 DIAGNOSIS — Y9389 Activity, other specified: Secondary | ICD-10-CM | POA: Insufficient documentation

## 2015-03-24 DIAGNOSIS — Z8669 Personal history of other diseases of the nervous system and sense organs: Secondary | ICD-10-CM | POA: Insufficient documentation

## 2015-03-24 DIAGNOSIS — S0990XA Unspecified injury of head, initial encounter: Secondary | ICD-10-CM | POA: Diagnosis present

## 2015-03-24 MED ORDER — SODIUM CHLORIDE 0.9 % IV BOLUS (SEPSIS)
1000.0000 mL | Freq: Once | INTRAVENOUS | Status: AC
  Administered 2015-03-24: 1000 mL via INTRAVENOUS

## 2015-03-24 MED ORDER — ACETAMINOPHEN 325 MG PO TABS
650.0000 mg | ORAL_TABLET | Freq: Once | ORAL | Status: AC
  Administered 2015-03-24: 650 mg via ORAL
  Filled 2015-03-24: qty 2

## 2015-03-24 MED ORDER — TETANUS-DIPHTH-ACELL PERTUSSIS 5-2.5-18.5 LF-MCG/0.5 IM SUSP
0.5000 mL | Freq: Once | INTRAMUSCULAR | Status: AC
  Administered 2015-03-24: 0.5 mL via INTRAMUSCULAR
  Filled 2015-03-24: qty 0.5

## 2015-03-24 MED ORDER — LORAZEPAM 1 MG PO TABS
1.0000 mg | ORAL_TABLET | Freq: Once | ORAL | Status: AC
  Administered 2015-03-24: 1 mg via ORAL
  Filled 2015-03-24: qty 1

## 2015-03-24 MED ORDER — ONDANSETRON HCL 4 MG/2ML IJ SOLN
4.0000 mg | Freq: Once | INTRAMUSCULAR | Status: AC
  Administered 2015-03-24: 4 mg via INTRAVENOUS
  Filled 2015-03-24: qty 2

## 2015-03-24 MED ORDER — LORAZEPAM 2 MG/ML IJ SOLN: 1.0000 mg | Freq: Once | INTRAMUSCULAR | Status: DC

## 2015-03-24 NOTE — ED Notes (Signed)
D/c home with family to drive. Follow up care discussed. Sister present to drive pt home

## 2015-03-24 NOTE — ED Notes (Signed)
Patient transported to CT 
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
## 2015-03-24 NOTE — ED Notes (Signed)
Patient transported to X-ray 

## 2015-03-24 NOTE — ED Notes (Signed)
MD at bedside. 

## 2015-03-24 NOTE — Discharge Instructions (Signed)
It was our pleasure to provide your ER care today - we hope that you feel better.  Rest. Drink adequate fluids.  Take tylenol/advil as need.   No driving today, or anytime if/when feeling confused or disoriented.  No contact sports, or operating heavy machinery,  until cleared to do so by your doctor.   Follow up with your doctor early next week if symptoms fail to improve/resolve.  Return to ER if worse, new symptoms, acutely worsening or severe headache, persistent vomiting, fevers, change in mental status, other concern.       Concussion, Adult A concussion, or closed-head injury, is a brain injury caused by a direct blow to the head or by a quick and sudden movement (jolt) of the head or neck. Concussions are usually not life-threatening. Even so, the effects of a concussion can be serious. If you have had a concussion before, you are more likely to experience concussion-like symptoms after a direct blow to the head.  CAUSES  Direct blow to the head, such as from running into another player during a soccer game, being hit in a fight, or hitting your head on a hard surface.  A jolt of the head or neck that causes the brain to move back and forth inside the skull, such as in a car crash. SIGNS AND SYMPTOMS The signs of a concussion can be hard to notice. Early on, they may be missed by you, family members, and health care providers. You may look fine but act or feel differently. Symptoms are usually temporary, but they may last for days, weeks, or even longer. Some symptoms may appear right away while others may not show up for hours or days. Every head injury is different. Symptoms include:  Mild to moderate headaches that will not go away.  A feeling of pressure inside your head.  Having more trouble than usual:  Learning or remembering things you have heard.  Answering questions.  Paying attention or concentrating.  Organizing daily tasks.  Making decisions and  solving problems.  Slowness in thinking, acting or reacting, speaking, or reading.  Getting lost or being easily confused.  Feeling tired all the time or lacking energy (fatigued).  Feeling drowsy.  Sleep disturbances.  Sleeping more than usual.  Sleeping less than usual.  Trouble falling asleep.  Trouble sleeping (insomnia).  Loss of balance or feeling lightheaded or dizzy.  Nausea or vomiting.  Numbness or tingling.  Increased sensitivity to:  Sounds.  Lights.  Distractions.  Vision problems or eyes that tire easily.  Diminished sense of taste or smell.  Ringing in the ears.  Mood changes such as feeling sad or anxious.  Becoming easily irritated or angry for little or no reason.  Lack of motivation.  Seeing or hearing things other people do not see or hear (hallucinations). DIAGNOSIS Your health care provider can usually diagnose a concussion based on a description of your injury and symptoms. He or she will ask whether you passed out (lost consciousness) and whether you are having trouble remembering events that happened right before and during your injury. Your evaluation might include:  A brain scan to look for signs of injury to the brain. Even if the test shows no injury, you may still have a concussion.  Blood tests to be sure other problems are not present. TREATMENT  Concussions are usually treated in an emergency department, in urgent care, or at a clinic. You may need to stay in the hospital overnight for further  treatment.  Tell your health care provider if you are taking any medicines, including prescription medicines, over-the-counter medicines, and natural remedies. Some medicines, such as blood thinners (anticoagulants) and aspirin, may increase the chance of complications. Also tell your health care provider whether you have had alcohol or are taking illegal drugs. This information may affect treatment.  Your health care provider will  send you home with important instructions to follow.  How fast you will recover from a concussion depends on many factors. These factors include how severe your concussion is, what part of your brain was injured, your age, and how healthy you were before the concussion.  Most people with mild injuries recover fully. Recovery can take time. In general, recovery is slower in older persons. Also, persons who have had a concussion in the past or have other medical problems may find that it takes longer to recover from their current injury. HOME CARE INSTRUCTIONS General Instructions  Carefully follow the directions your health care provider gave you.  Only take over-the-counter or prescription medicines for pain, discomfort, or fever as directed by your health care provider.  Take only those medicines that your health care provider has approved.  Do not drink alcohol until your health care provider says you are well enough to do so. Alcohol and certain other drugs may slow your recovery and can put you at risk of further injury.  If it is harder than usual to remember things, write them down.  If you are easily distracted, try to do one thing at a time. For example, do not try to watch TV while fixing dinner.  Talk with family members or close friends when making important decisions.  Keep all follow-up appointments. Repeated evaluation of your symptoms is recommended for your recovery.  Watch your symptoms and tell others to do the same. Complications sometimes occur after a concussion. Older adults with a brain injury may have a higher risk of serious complications, such as a blood clot on the brain.  Tell your teachers, school nurse, school counselor, coach, athletic trainer, or work Production designer, theatre/television/film about your injury, symptoms, and restrictions. Tell them about what you can or cannot do. They should watch for:  Increased problems with attention or concentration.  Increased difficulty remembering  or learning new information.  Increased time needed to complete tasks or assignments.  Increased irritability or decreased ability to cope with stress.  Increased symptoms.  Rest. Rest helps the brain to heal. Make sure you:  Get plenty of sleep at night. Avoid staying up late at night.  Keep the same bedtime hours on weekends and weekdays.  Rest during the day. Take daytime naps or rest breaks when you feel tired.  Limit activities that require a lot of thought or concentration. These include:  Doing homework or job-related work.  Watching TV.  Working on the computer.  Avoid any situation where there is potential for another head injury (football, hockey, soccer, basketball, martial arts, downhill snow sports and horseback riding). Your condition will get worse every time you experience a concussion. You should avoid these activities until you are evaluated by the appropriate follow-up health care providers. Returning To Your Regular Activities You will need to return to your normal activities slowly, not all at once. You must give your body and brain enough time for recovery.  Do not return to sports or other athletic activities until your health care provider tells you it is safe to do so.  Ask your health care  provider when you can drive, ride a bicycle, or operate heavy machinery. Your ability to react may be slower after a brain injury. Never do these activities if you are dizzy.  Ask your health care provider about when you can return to work or school. Preventing Another Concussion It is very important to avoid another brain injury, especially before you have recovered. In rare cases, another injury can lead to permanent brain damage, brain swelling, or death. The risk of this is greatest during the first 7-10 days after a head injury. Avoid injuries by:  Wearing a seat belt when riding in a car.  Drinking alcohol only in moderation.  Wearing a helmet when biking,  skiing, skateboarding, skating, or doing similar activities.  Avoiding activities that could lead to a second concussion, such as contact or recreational sports, until your health care provider says it is okay.  Taking safety measures in your home.  Remove clutter and tripping hazards from floors and stairways.  Use grab bars in bathrooms and handrails by stairs.  Place non-slip mats on floors and in bathtubs.  Improve lighting in dim areas. SEEK MEDICAL CARE IF:  You have increased problems paying attention or concentrating.  You have increased difficulty remembering or learning new information.  You need more time to complete tasks or assignments than before.  You have increased irritability or decreased ability to cope with stress.  You have more symptoms than before. Seek medical care if you have any of the following symptoms for more than 2 weeks after your injury:  Lasting (chronic) headaches.  Dizziness or balance problems.  Nausea.  Vision problems.  Increased sensitivity to noise or light.  Depression or mood swings.  Anxiety or irritability.  Memory problems.  Difficulty concentrating or paying attention.  Sleep problems.  Feeling tired all the time. SEEK IMMEDIATE MEDICAL CARE IF:  You have severe or worsening headaches. These may be a sign of a blood clot in the brain.  You have weakness (even if only in one hand, leg, or part of the face).  You have numbness.  You have decreased coordination.  You vomit repeatedly.  You have increased sleepiness.  One pupil is larger than the other.  You have convulsions.  You have slurred speech.  You have increased confusion. This may be a sign of a blood clot in the brain.  You have increased restlessness, agitation, or irritability.  You are unable to recognize people or places.  You have neck pain.  It is difficult to wake you up.  You have unusual behavior changes.  You lose  consciousness. MAKE SURE YOU:  Understand these instructions.  Will watch your condition.  Will get help right away if you are not doing well or get worse.   This information is not intended to replace advice given to you by your health care provider. Make sure you discuss any questions you have with your health care provider.   Document Released: 07/09/2003 Document Revised: 05/09/2014 Document Reviewed: 11/08/2012 Elsevier Interactive Patient Education 2016 [employer]   Facial or Scalp Contusion A facial or scalp contusion is a deep bruise on the face or head. Injuries to the face and head generally cause a lot of swelling, especially around the eyes. Contusions are the result of an injury that caused bleeding under the skin. The contusion may turn blue, purple, or yellow. Minor injuries will give you a painless contusion, but more severe contusions may stay painful and swollen for a few  weeks.  CAUSES  A facial or scalp contusion is caused by a blunt injury or trauma to the face or head area.  SIGNS AND SYMPTOMS   Swelling of the injured area.   Discoloration of the injured area.   Tenderness, soreness, or pain in the injured area.  DIAGNOSIS  The diagnosis can be made by taking a medical history and doing a physical exam. An X-ray exam, CT scan, or MRI may be needed to determine if there are any associated injuries, such as broken bones (fractures). TREATMENT  Often, the best treatment for a facial or scalp contusion is applying cold compresses to the injured area. Over-the-counter medicines may also be recommended for pain control.  HOME CARE INSTRUCTIONS   Only take over-the-counter or prescription medicines as directed by your health care provider.   Apply ice to the injured area.   Put ice in a plastic bag.   Place a towel between your skin and the bag.   Leave the ice on for 20 minutes, 2-3 times a day.  SEEK MEDICAL CARE IF:  You have bite problems.    You have pain with chewing.   You are concerned about facial defects. SEEK IMMEDIATE MEDICAL CARE IF:  You have severe pain or a headache that is not relieved by medicine.   You have unusual sleepiness, confusion, or personality changes.   You throw up (vomit).   You have a persistent nosebleed.   You have double vision or blurred vision.   You have fluid drainage from your nose or ear.   You have difficulty walking or using your arms or legs.  MAKE SURE YOU:   Understand these instructions.  Will watch your condition.  Will get help right away if you are not doing well or get worse.   This information is not intended to replace advice given to you by your health care provider. Make sure you discuss any questions you have with your health care provider.   Document Released: 05/26/2004 Document Revised: 05/09/2014 Document Reviewed: 11/29/2012 Elsevier Interactive Patient Education 2016 [employer] Inc.    Wound Care Taking care of your wound properly can help to prevent pain and infection. It can also help your wound to heal more quickly.  HOW TO CARE FOR YOUR WOUND  Take or apply over-the-counter and prescription medicines only as told by your health care provider.  If you were prescribed antibiotic medicine, take or apply it as told by your health care provider. Do not stop using the antibiotic even if your condition improves.  Clean the wound each day or as told by your health care provider.  Wash the wound with mild soap and water.  Rinse the wound with water to remove all soap.  Pat the wound dry with a clean towel. Do not rub it.  There are many different ways to close and cover a wound. For example, a wound can be covered with stitches (sutures), skin glue, or adhesive strips. Follow instructions from your health care provider about:  How to take care of your wound.  When and how you should change your bandage (dressing).  When you should  remove your dressing.  Removing whatever was used to close your wound.  Check your wound every day for signs of infection. Watch for:  Redness, swelling, or pain.  Fluid, blood, or pus.  Keep the dressing dry until your health care provider says it can be removed. Do not take baths, swim, use a hot tub,  or do anything that would put your wound underwater until your health care provider approves.  Raise (elevate) the injured area above the level of your heart while you are sitting or lying down.  Do not scratch or pick at the wound.  Keep all follow-up visits as told by your health care provider. This is important. SEEK MEDICAL CARE IF:  You received a tetanus shot and you have swelling, severe pain, redness, or bleeding at the injection site.  You have a fever.  Your pain is not controlled with medicine.  You have increased redness, swelling, or pain at the site of your wound.  You have fluid, blood, or pus coming from your wound.  You notice a bad smell coming from your wound or your dressing. SEEK IMMEDIATE MEDICAL CARE IF:  You have a red streak going away from your wound.   This information is not intended to replace advice given to you by your health care provider. Make sure you discuss any questions you have with your health care provider.   Document Released: 01/26/2008 Document Revised: 09/02/2014 Document Reviewed: 04/14/2014 Elsevier Interactive Patient Education [employer]

## 2015-03-24 NOTE — ED Notes (Signed)
MD at bedside to assess head laceration
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
## 2015-04-08 ENCOUNTER — Ambulatory Visit (INDEPENDENT_AMBULATORY_CARE_PROVIDER_SITE_OTHER): Payer: No Typology Code available for payment source

## 2015-04-08 DIAGNOSIS — Z23 Encounter for immunization: Secondary | ICD-10-CM

## 2015-07-07 ENCOUNTER — Ambulatory Visit (HOSPITAL_BASED_OUTPATIENT_CLINIC_OR_DEPARTMENT_OTHER)
Admission: RE | Admit: 2015-07-07 | Discharge: 2015-07-07 | Disposition: A | Discharge status: Home / Self Care | Payer: BLUE CROSS/BLUE SHIELD | Source: Ambulatory Visit | Attending: Physician Assistant | Admitting: Physician Assistant

## 2015-07-07 ENCOUNTER — Ambulatory Visit (INDEPENDENT_AMBULATORY_CARE_PROVIDER_SITE_OTHER): Payer: BLUE CROSS/BLUE SHIELD | Admitting: Physician Assistant

## 2015-07-07 ENCOUNTER — Encounter: Payer: Self-pay | Admitting: Physician Assistant

## 2015-07-07 ENCOUNTER — Other Ambulatory Visit (HOSPITAL_BASED_OUTPATIENT_CLINIC_OR_DEPARTMENT_OTHER): Payer: Self-pay

## 2015-07-07 VITALS — BP 146/93 | HR 72 | Temp 98.1°F | Ht 67.0 in | Wt 197.2 lb

## 2015-07-07 DIAGNOSIS — Z202 Contact with and (suspected) exposure to infections with a predominantly sexual mode of transmission: Secondary | ICD-10-CM

## 2015-07-07 DIAGNOSIS — F418 Other specified anxiety disorders: Secondary | ICD-10-CM | POA: Diagnosis not present

## 2015-07-07 DIAGNOSIS — N50812 Left testicular pain: Secondary | ICD-10-CM | POA: Diagnosis not present

## 2015-07-07 DIAGNOSIS — F329 Major depressive disorder, single episode, unspecified: Secondary | ICD-10-CM

## 2015-07-07 DIAGNOSIS — R002 Palpitations: Secondary | ICD-10-CM

## 2015-07-07 DIAGNOSIS — M25512 Pain in left shoulder: Secondary | ICD-10-CM

## 2015-07-07 DIAGNOSIS — F419 Anxiety disorder, unspecified: Principal | ICD-10-CM

## 2015-07-07 DIAGNOSIS — F32A Depression, unspecified: Secondary | ICD-10-CM

## 2015-07-07 LAB — POC URINALSYSI DIPSTICK (AUTOMATED)
BILIRUBIN UA: NEGATIVE
Glucose, UA: NEGATIVE
Ketones, UA: NEGATIVE
Leukocytes, UA: NEGATIVE
NITRITE UA: NEGATIVE
PH UA: 7.5
RBC UA: NEGATIVE
SPEC GRAV UA: 1.02
UROBILINOGEN UA: 0.2

## 2015-07-07 MED ORDER — PAROXETINE HCL 10 MG PO TABS: 10.0000 mg | ORAL_TABLET | Freq: Every day | ORAL | Status: DC

## 2015-07-07 MED ORDER — CLONAZEPAM 0.5 MG PO TABS: 0.5000 mg | ORAL_TABLET | Freq: Two times a day (BID) | ORAL | Status: DC | PRN

## 2015-07-07 NOTE — Progress Notes (Signed)
Pre visit review using our clinic review tool, if applicable. No additional management support is needed unless otherwise documented below in the visit note. 

## 2015-07-07 NOTE — Patient Instructions (Signed)
Please begin the Paxil daily as directed (get filled at [facility] because it is 4 dollars). Take Klonopin as directed for acute anxiety only.   Please keep your phone on. You will be contacted by Sports Medicine for further assessment of your shoulder.  Follow-up in 1 month.
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
## 2015-07-08 LAB — RPR

## 2015-07-08 LAB — GC/CHLAMYDIA PROBE AMP
CT Probe RNA: NOT DETECTED
GC PROBE AMP APTIMA: NOT DETECTED

## 2015-07-08 LAB — HIV ANTIBODY (ROUTINE TESTING W REFLEX): HIV: NONREACTIVE

## 2015-07-12 NOTE — Assessment & Plan Note (Signed)
Will obtain STI panel today. Patient asymptomatic. Safe sex practices reviewed with patient.

## 2015-07-12 NOTE — Assessment & Plan Note (Signed)
EKG obtained revealing sinus bradycardia at 59 bpm. Will begin paxil 10 mg daily. Rx Klonopin (short-term) given to use until SSRI reaches therapeutic effect. Counseling recommended. Handout given. Follow-up 3-4 weeks.

## 2015-08-04 ENCOUNTER — Telehealth: Payer: Self-pay | Admitting: Physician Assistant

## 2015-08-04 NOTE — Telephone Encounter (Signed)
Patient left message on Voicemail 4/4 to cancel appointment scheduled for 08/05/2015

## 2015-08-05 ENCOUNTER — Ambulatory Visit: Payer: BLUE CROSS/BLUE SHIELD | Admitting: Physician Assistant

## 2015-09-16 ENCOUNTER — Other Ambulatory Visit: Payer: Self-pay | Admitting: Physician Assistant

## 2015-09-16 NOTE — Telephone Encounter (Signed)
Refill sent per [facility] refill protocol/SLS  

## 2015-09-18 ENCOUNTER — Other Ambulatory Visit: Payer: Self-pay | Admitting: Physician Assistant

## 2015-09-18 MED ORDER — VALACYCLOVIR HCL 1 G PO TABS: 1000.0000 mg | ORAL_TABLET | Freq: Every day | ORAL | Status: DC

## 2015-09-18 NOTE — Telephone Encounter (Signed)
°  Relation to TK:ZSWF Call back [phone] Pharmacy:[facility]  Reason for call: pt is needing rx   valACYclovir (VALTREX) 1000 MG tablet

## 2015-09-18 NOTE — Telephone Encounter (Signed)
Called and spoke with the pt and informed him of the note below.  Pt verbalized understanding.//AB/CMA 

## 2015-09-18 NOTE — Telephone Encounter (Signed)
Can be reached: [phone] Pharmacy: [id number] - [place], [place] - [address]  Reason for call: pt calling again to f/u on RX request

## 2015-09-18 NOTE — Telephone Encounter (Signed)
Has he checked with pharmacy. Medication was sent to the pharmacy on 09/16/15. I will resend and there should be no further issue.

## 2015-10-19 ENCOUNTER — Other Ambulatory Visit: Payer: Self-pay | Admitting: Physician Assistant

## 2015-10-19 MED ORDER — VALACYCLOVIR HCL 1 G PO TABS: 1000.0000 mg | ORAL_TABLET | Freq: Every day | ORAL | Status: DC

## 2015-10-19 NOTE — Telephone Encounter (Signed)
Refill has been sent.  °

## 2015-10-19 NOTE — Telephone Encounter (Signed)
Can be reached: [phone] Pharmacy: [id number] - [place], [place] - [address]   Reason for call: pt needing refill on valacyclovir. Has 2 left. Takes 1/day.

## 2015-10-21 ENCOUNTER — Encounter (HOSPITAL_BASED_OUTPATIENT_CLINIC_OR_DEPARTMENT_OTHER): Payer: Self-pay

## 2015-10-21 ENCOUNTER — Emergency Department (HOSPITAL_BASED_OUTPATIENT_CLINIC_OR_DEPARTMENT_OTHER): Payer: BLUE CROSS/BLUE SHIELD

## 2015-10-21 ENCOUNTER — Emergency Department (HOSPITAL_BASED_OUTPATIENT_CLINIC_OR_DEPARTMENT_OTHER)
Admission: EM | Admit: 2015-10-21 | Discharge: 2015-10-21 | Disposition: A | Discharge status: Home / Self Care | Payer: BLUE CROSS/BLUE SHIELD | Attending: Emergency Medicine | Admitting: Emergency Medicine

## 2015-10-21 DIAGNOSIS — M25511 Pain in right shoulder: Secondary | ICD-10-CM | POA: Insufficient documentation

## 2015-10-21 DIAGNOSIS — F17210 Nicotine dependence, cigarettes, uncomplicated: Secondary | ICD-10-CM | POA: Diagnosis not present

## 2015-10-21 MED ORDER — IBUPROFEN 400 MG PO TABS
600.0000 mg | ORAL_TABLET | Freq: Once | ORAL | Status: AC
  Administered 2015-10-21: 600 mg via ORAL
  Filled 2015-10-21: qty 1

## 2015-10-21 NOTE — Discharge Instructions (Signed)
Heat Therapy °Heat therapy can help ease sore, stiff, injured, and tight muscles and joints. Heat relaxes your muscles, which may help ease your pain. Heat therapy should only be used on old, pre-existing, or long-lasting (chronic) injuries. Do not use heat therapy unless told by your doctor. °HOW TO USE HEAT THERAPY °There are several different kinds of heat therapy, including: °· Moist heat pack. °· Warm water bath. °· Hot water bottle. °· Electric heating pad. °· Heated gel pack. °· Heated wrap. °· Electric heating pad. °GENERAL HEAT THERAPY RECOMMENDATIONS  °· Do not sleep while using heat therapy. Only use heat therapy while you are awake. °· Your skin may turn pink while using heat therapy. Do not use heat therapy if your skin turns red. °· Do not use heat therapy if you have new pain. °· High heat or long exposure to heat can cause burns. Be careful when using heat therapy to avoid burning your skin. °· Do not use heat therapy on areas of your skin that are already irritated, such as with a rash or sunburn. °GET HELP IF:  °· You have blisters, redness, swelling (puffiness), or numbness. °· You have new pain. °· Your pain is worse. °MAKE SURE YOU: °· Understand these instructions. °· Will watch your condition. °· Will get help right away if you are not doing well or get worse. °  °This information is not intended to replace advice given to you by your health care provider. Make sure you discuss any questions you have with your health care provider. °  °Document Released: 07/11/2011 Document Revised: 05/09/2014 Document Reviewed: 06/11/2013 °Elsevier Interactive Patient Education ©2016 [employer] ° °

## 2015-10-21 NOTE — ED Notes (Signed)
Patient complains of right shoulder pain x 3 days, denies trauma but does heavy lifting daily at work. Pain when he lays on shoulder or lifts about level of heart.

## 2015-10-21 NOTE — ED Provider Notes (Signed)
CSN: [id number]     Arrival date & time 10/21/15  0847 History   First MD Initiated Contact with Patient 10/21/15 0900     Chief Complaint  Patient presents with  . Shoulder Pain     Patient is a 35 y.o. male presenting with shoulder pain. The history is provided by the patient.  Shoulder Pain Location:  Shoulder Injury: no   Shoulder location:  R shoulder Pain details:    Quality:  Aching   Radiates to: right elbow.   Severity:  Moderate   Onset quality:  Gradual   Duration:  3 days   Timing:  Intermittent   Progression:  Unchanged Chronicity:  New Dislocation: no   Prior injury to area:  No Relieved by:  Rest Worsened by:  Movement Associated symptoms: no back pain, no fever, no muscle weakness, no neck pain and no swelling   Patient woke up with right shoulder pain 3 days ago No falls/injury Reports pain worsens with movement/lifting and throwing ball No fever No cp/sob No weakness No neck pain No h/o surgery to shoulder   Past Medical History  Diagnosis Date  . Gout   . Inguinal hernia   . CTS (carpal tunnel syndrome)   . HSV-2 (herpes simplex virus 2) infection    Past Surgical History  Procedure Laterality Date  . Carpal tunnel release Left   . Toe surgery Right   . Hernia repair    . Carpal tunnel release      Left Hand  . Bone spur      Left Foot   Family History  Problem Relation Age of Onset  . Hypertension Neg Hx   . Diabetes Neg Hx   . Breast cancer Maternal Grandmother   . Healthy Mother   . Suicidality Father     Deceased  . Hypertension Paternal Grandfather   . Hypertension Maternal Grandfather   . Diabetes Paternal Grandfather   . Diabetes Maternal Grandfather   . Heart disease Paternal Grandmother     Enlarged Heart  . Healthy Brother     x1 Half  . Healthy Sister     x2  . Healthy Daughter     x2  . Healthy Son     x1   Social History  Substance Use Topics  . Smoking status: Current Some Day Smoker -- 1.00 packs/day for  15 years    Types: Cigarettes  . Smokeless tobacco: Former Neurosurgeon  . Alcohol Use: 0.6 oz/week    1 Standard drinks or equivalent per week     Comment: 2 drinks/month    Review of Systems  Constitutional: Negative for fever.  Respiratory: Negative for shortness of breath.   Cardiovascular: Negative for chest pain.  Musculoskeletal: Positive for arthralgias. Negative for back pain and neck pain.  Neurological: Negative for weakness.      Allergies  Review of patient's allergies indicates no known allergies.  Home Medications   Prior to Admission medications   Medication Sig Start Date End Date Taking? Authorizing Provider  clonazePAM (KLONOPIN) 0.5 MG tablet Take 1 tablet (0.5 mg total) by mouth 2 (two) times daily as needed for anxiety. 07/07/15   Waldon Merl, PA-C  PARoxetine (PAXIL) 10 MG tablet Take 1 tablet (10 mg total) by mouth daily. 07/07/15   Waldon Merl, PA-C  valACYclovir (VALTREX) 1000 MG tablet Take 1 tablet (1,000 mg total) by mouth daily. 10/19/15   Waldon Merl, PA-C   BP 135/92 mmHg  Pulse 72  Temp(Src) 98.4 F (36.9 C) (Oral)  Resp 16  Ht 5\' 6"  (1.676 m)  Wt 86.183 kg  BMI 30.68 kg/m2  SpO2 100% Physical Exam CONSTITUTIONAL: Well developed/well nourished HEAD: Normocephalic/atraumatic ENMT: Mucous membranes moist NECK: supple no meningeal signs SPINE/BACK:entire spine nontender CV: S1/S2 noted, no murmurs/rubs/gallops noted LUNGS: Lungs are clear to auscultation bilaterally, no apparent distress ABDOMEN: soft, nontender NEURO: Pt is awake/alert/appropriate, moves all extremitiesx4.  No facial droop.  Equal hand grips and equal strength with flexion/extension of wrist/elbows EXTREMITIES: pulses normal/equal, full ROM, no tenderness to palpation of R shoulder.  No warmth/erythema/edema.  Pain is reproduced when he abducts the shoulder to horizontal.  No deformities noted SKIN: warm, color normal PSYCH: no abnormalities of mood noted, alert and  oriented to situation  ED Course  Procedures  Advised conservative measures and f/u with sports med in 2 weeks   Imaging Review Dg Shoulder Right  10/21/2015  CLINICAL DATA:  Right upper shoulder pain after waking up several days ago. Continued pain. EXAM: RIGHT SHOULDER - 2+ VIEW COMPARISON:  None. 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 Subacromial morphology type 2 (curved). AC joint alignment normal. Glenohumeral joint alignment normal. No other significant findings. IMPRESSION: 1. 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 Otherwise, no significant abnormalities are observed. If pain persists despite conservative therapy, MRI may be warranted for further characterization. Electronically Signed   By: 10/23/2015 M.D.   On: 10/21/2015 09:10   I have personally reviewed and evaluated these images results as part of my medical decision-making.   MDM   Final diagnoses:  Arthralgia of right shoulder region    Nursing notes including past medical history and social history reviewed and considered in documentation xrays/imaging reviewed by myself and considered during evaluation     10/23/2015, MD 10/21/15 [phone]

## 2015-10-23 ENCOUNTER — Telehealth: Payer: Self-pay | Admitting: Physician Assistant

## 2015-10-23 NOTE — Telephone Encounter (Signed)
Patient Name: Zachary Mason  DOB: 01/29/1981    Initial Comment bad pain in chest and arm, face feels numb, lightheaded, happened this morning and now just a few min ago.    Nurse Assessment  Nurse: Chestine Spore, RN, Marchelle Folks Date/Time (Eastern Time): 10/23/2015 1:55:49 PM  Confirm and document reason for call. If symptomatic, describe symptoms. You must click the next button to save text entered. ---Caller states that he has bad pain in chest and arm, face feels numb, lightheaded, happened this morning and now just a few minutes ago. He feels like he may have some SOB.  Has the patient traveled out of the country within the last 30 days? ---Not Applicable  Does the patient have any new or worsening symptoms? ---Yes  Will a triage be completed? ---Yes  Related visit to physician within the last 2 weeks? ---Yes  Does the PT have any chronic conditions? (i.e. diabetes, asthma, etc.) ---No  Is this a behavioral health or substance abuse call? ---No     Guidelines    Guideline Title Affirmed Question Affirmed Notes  Chest Pain [1] Chest pain lasts > 5 minutes AND [2] described as crushing, pressure-like, or heavy    Final Disposition User   Call EMS 911 Now Chestine Spore, RN, Marchelle Folks    Comments  Will have someone drive him to the ER instead of calling 911   Referrals  [facility] - ED   Disagree/Comply: Disagree  Disagree/Comply Reason: Disagree with instructions

## 2015-10-23 NOTE — Telephone Encounter (Signed)
Called patient to se if he had chanded his mind about going to UC or ED regarding his chest pain. No answer left message for return call on answering machine.

## 2015-10-30 ENCOUNTER — Encounter: Payer: Self-pay | Admitting: Family

## 2015-10-30 ENCOUNTER — Ambulatory Visit (INDEPENDENT_AMBULATORY_CARE_PROVIDER_SITE_OTHER): Payer: BLUE CROSS/BLUE SHIELD | Admitting: Family

## 2015-10-30 VITALS — BP 138/88 | HR 66 | Temp 98.0°F | Resp 16 | Ht 67.0 in | Wt 198.0 lb

## 2015-10-30 DIAGNOSIS — M25511 Pain in right shoulder: Secondary | ICD-10-CM | POA: Diagnosis not present

## 2015-10-30 MED ORDER — MELOXICAM 15 MG PO TABS: 7.5000 mg | ORAL_TABLET | Freq: Every day | ORAL | Status: DC

## 2015-10-30 MED ORDER — DICLOFENAC SODIUM 2 % TD SOLN: 1.0000 "application " | Freq: Two times a day (BID) | TRANSDERMAL | Status: DC | PRN

## 2015-10-30 NOTE — Progress Notes (Signed)
Pre visit review using our clinic review tool, if applicable. No additional management support is needed unless otherwise documented below in the visit note. 

## 2015-10-30 NOTE — Progress Notes (Signed)
Subjective:    Patient ID: Zachary Mason, male    DOB: 08-07-1980, 35 y.o.   MRN: [id number]  Chief Complaint  Patient presents with  . Shoulder Pain    right shoulder pain that goes down his right arm and to chest, x2 weeks     HPI:  Zachary Mason is a 35 y.o. male who  has a past medical history of Gout; Inguinal hernia; CTS (carpal tunnel syndrome); and HSV-2 (herpes simplex virus 2) infection. and presents today for an office visit.   Recently evaluated in the emergency department for right shoulder pain described as aching with three-day onset prior to arrival. Denies any trauma other aggravated with lifting/movement and throwing a ball. 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 MRI was suggested if symptoms worsen or do not improve with conservative therapy. He was discharged from the emergency department with ibuprofen 600 mg and shortness follow-up with primary care/sports medicine.  Continues to experience the associated symptom of pain located in his right shoulder described as worsening throughout the day. When lifting things he feels like his shoulder is going to "pop" on occasion. 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 Reports taking the ibuprofen which has not been helping with his symptoms. Described as continued aching. Does have occasional numbness and tingling during certain movements. Denies neck pain. Severity of the symptoms is enough to wake him up at night.   No Known Allergies   Current Outpatient Prescriptions on File Prior to Visit  Medication Sig Dispense Refill  . clonazePAM (KLONOPIN) 0.5 MG tablet Take 1 tablet (0.5 mg total) by mouth 2 (two) times daily as needed for anxiety. 30 tablet 0  . PARoxetine (PAXIL) 10 MG tablet Take 1 tablet (10 mg total) by mouth daily. 30 tablet 1  . valACYclovir (VALTREX) 1000 MG tablet Take 1 tablet (1,000  mg total) by mouth daily. 30 tablet 3   No current facility-administered medications on file prior to visit.     Past Surgical History  Procedure Laterality Date  . Carpal tunnel release Left   . Toe surgery Right   . Hernia repair    . Carpal tunnel release      Left Hand  . Bone spur      Left Foot    Review of Systems  Constitutional: Negative for fever and chills.  Musculoskeletal:       Positive for right shoulder pain  Neurological: Negative for weakness and numbness.      Objective:    BP 138/88 mmHg  Pulse 66  Temp(Src) 98 F (36.7 C) (Oral)  Resp 16  Ht 5\' 7"  (1.702 m)  Wt 198 lb (89.812 kg)  BMI 31.00 kg/m2  SpO2 96% Nursing note and vital signs reviewed.  Physical Exam  Constitutional: He is oriented to person, place, and time. He appears well-developed and well-nourished. No distress.  Cardiovascular: Normal rate, regular rhythm, normal heart sounds and intact distal pulses.   Pulmonary/Chest: Effort normal and breath sounds normal.  Musculoskeletal:  Right shoulder - obvious deformity, discoloration, or edema. Tenderness elicited over supraspinatus tendon and long head biceps proximally. Range of motion is decreased above 120 of flexion and abduction. There is decreased strength with empty can, and resisted external rotation. Distal pulses and sensation are intact and appropriate. Positive ; negative Neer's impingement; negative apprehension.  Neurological: He is alert and oriented to  person, place, and time.  Skin: Skin is warm and dry.  Psychiatric: He has a normal mood and affect. His behavior is normal. Judgment and thought content normal.   Examination: Ultrasound of Right Shoulder Date:  10/30/2015 Patient Name: Zachary Mason History: Right shoulder pain with decreased range of motion and awakening at night.   Findings:  No evidence of joint effusion. The biceps brachii long head tendon is normal without tendinosis, tear,  tenosynovitis, or subluxation/dislocation. The infraspinatus, subscapularis, and teres minor tendons are all normal. Questionable tear of the supraspinatus tendon. No subacromial-subdeltoid bursal abnormality and no sonographic evidence for subacromial impingement with dynamic maneuvers. The posterior labrum was unremarkable. Additional focused evaluation site of maximal symptoms was unrevealing.  Impression:  Possible tear of the supraspinatus tendon.             All images are located under the media tab. [place] ordered, performed and interpreted by Marcos Eke, FNP  Procedure: Corticosteroid injection of the right shoulder  Description: Informed consent was obtained with discussion including risks and benefits of the procedure. Patient verbally wished to continued. Time out was performed. The right shoulder was marked and identified. Site was cleansed with betadine using a concentric circular pattern. Skin anesthesia was applied using cold spray applied for 10 seconds. Using a posterior approach Injection of 1:4 of Kenalog to Sensoricaine was injected following aspiration with no return noted. Following the injection there was relief confirming appropriate placement. A bandage was applied to the area and post care instructions were provided. The procedure was tolerated well with no complications.      Assessment & Plan:   Problem List Items Addressed This Visit      Other   Right shoulder pain - Primary    Symptoms and exam consistent with rotator cuff tear with questionable confirmation on the ultrasound versus anisoptry. Cortisone injection provided with post care instructions provided. Continue conservative treatment with ice, meloxicam, and Pennsaid. Follow up in 3 weeks or sooner if needed.       Relevant Medications   meloxicam (MOBIC) 15 MG tablet   Diclofenac Sodium (PENNSAID) 2 % SOLN   Other Relevant Orders   [place] Extrem Up Right Ltd       I am having Mr. Launer start on  meloxicam and Diclofenac Sodium. I am also having him maintain his PARoxetine, clonazePAM, and valACYclovir.   Meds ordered this encounter  Medications  . meloxicam (MOBIC) 15 MG tablet    Sig: Take 0.5-1 tablets (7.5-15 mg total) by mouth daily.    Dispense:  30 tablet    Refill:  0    Order Specific Question:  Supervising Provider    Answer:  Hillard Danker A [[id number]]  . Diclofenac Sodium (PENNSAID) 2 % SOLN    Sig: Place 1 application onto the skin 2 (two) times daily as needed.    Dispense:  112 g    Refill:  1    Order Specific Question:  Supervising Provider    Answer:  Hillard Danker A [[id number]]     Follow-up: Return in about 3 weeks (around 11/20/2015), or if symptoms worsen or fail to improve.  Jeanine Luz, FNP

## 2015-10-30 NOTE — Patient Instructions (Addendum)
Thank you for choosing [facility].  Summary/Instructions:  Ice for 20 minutes 3-4 times per day as needed  No strenuous activity 24 hours.  Pennsaid - 2 times per day about half a pack to the affected area.  Meloxicam - daily.  Exercises daily.  Your prescription(s) have been submitted to your pharmacy or been printed and provided for you. Please take as directed and contact our office if you believe you are having problem(s) with the medication(s) or have any questions.  If your symptoms worsen or fail to improve, please contact our office for further instruction, or in case of emergency go directly to the emergency room at the closest medical facility.    Impingement Syndrome, Rotator Cuff, Bursitis With Rehab Impingement syndrome is a condition that involves inflammation of the tendons of the rotator cuff and the subacromial bursa, that causes pain in the shoulder. The rotator cuff consists of four tendons and muscles that control much of the shoulder and upper arm function. The subacromial bursa is a fluid filled sac that helps reduce friction between the rotator cuff and one of the bones of the shoulder (acromion). Impingement syndrome is usually an overuse injury that causes swelling of the bursa (bursitis), swelling of the tendon (tendonitis), and/or a tear of the tendon (strain). Strains are classified into three categories. Grade 1 strains cause pain, but the tendon is not lengthened. Grade 2 strains include a lengthened ligament, due to the ligament being stretched or partially ruptured. With grade 2 strains there is still function, although the function may be decreased. Grade 3 strains include a complete tear of the tendon or muscle, and function is usually impaired. SYMPTOMS   Pain around the shoulder, often at the outer portion of the upper arm.  Pain that gets worse with shoulder function, especially when reaching overhead or lifting.  Sometimes, aching when not  using the arm.  Pain that wakes you up at night.  Sometimes, tenderness, swelling, warmth, or redness over the affected area.  Loss of strength.  Limited motion of the shoulder, especially reaching behind the back (to the back pocket or to unhook bra) or across your body.  Crackling sound (crepitation) when moving the arm.  Biceps tendon pain and inflammation (in the front of the shoulder). Worse when bending the elbow or lifting. CAUSES  Impingement syndrome is often an overuse injury, in which chronic (repetitive) motions cause the tendons or bursa to become inflamed. A strain occurs when a force is paced on the tendon or muscle that is greater than it can withstand. Common mechanisms of injury include: Stress from sudden increase in duration, frequency, or intensity of training.  Direct hit (trauma) to the shoulder.  Aging, erosion of the tendon with normal use.  Bony bump on shoulder (acromial spur). RISK INCREASES WITH:  Contact sports (football, wrestling, boxing).  Throwing sports (baseball, tennis, volleyball).  Weightlifting and bodybuilding.  Heavy labor.  Previous injury to the rotator cuff, including impingement.  Poor shoulder strength and flexibility.  Failure to warm up properly before activity.  Inadequate protective equipment.  Old age.  Bony bump on shoulder (acromial spur). PREVENTION   Warm up and stretch properly before activity.  Allow for adequate recovery between workouts.  Maintain physical fitness:  Strength, flexibility, and endurance.  Cardiovascular fitness.  Learn and use proper exercise technique. PROGNOSIS  If treated properly, impingement syndrome usually goes away within 6 weeks. Sometimes surgery is required.  RELATED COMPLICATIONS   Longer healing time if  not properly treated, or if not given enough time to heal.  Recurring symptoms, that result in a chronic condition.  Shoulder stiffness, frozen shoulder, or loss of  motion.  Rotator cuff tendon tear.  Recurring symptoms, especially if activity is resumed too soon, with overuse, with a direct blow, or when using poor technique. TREATMENT  Treatment first involves the use of ice and medicine, to reduce pain and inflammation. The use of strengthening and stretching exercises may help reduce pain with activity. These exercises may be performed at home or with a therapist. If non-surgical treatment is unsuccessful after more than 6 months, surgery may be advised. After surgery and rehabilitation, activity is usually possible in 3 months.  MEDICATION  If pain medicine is needed, nonsteroidal anti-inflammatory medicines (aspirin and ibuprofen), or other minor pain relievers (acetaminophen), are often advised.  Do not take pain medicine for 7 days before surgery.  Prescription pain relievers may be given, if your caregiver thinks they are needed. Use only as directed and only as much as you need.  Corticosteroid injections may be given by your caregiver. These injections should be reserved for the most serious cases, because they may only be given a certain number of times. HEAT AND COLD  Cold treatment (icing) should be applied for 10 to 15 minutes every 2 to 3 hours for inflammation and pain, and immediately after activity that aggravates your symptoms. Use ice packs or an ice massage.  Heat treatment may be used before performing stretching and strengthening activities prescribed by your caregiver, physical therapist, or athletic trainer. Use a heat pack or a warm water soak. SEEK MEDICAL CARE IF:   Symptoms get worse or do not improve in 4 to 6 weeks, despite treatment.  New, unexplained symptoms develop. (Drugs used in treatment may produce side effects.) EXERCISES  RANGE OF MOTION (ROM) AND STRETCHING EXERCISES - Impingement Syndrome (Rotator Cuff  Tendinitis, Bursitis) These exercises may help you when beginning to rehabilitate your injury. Your  symptoms may go away with or without further involvement from your physician, physical therapist or athletic trainer. While completing these exercises, remember:   Restoring tissue flexibility helps normal motion to return to the joints. This allows healthier, less painful movement and activity.  An effective stretch should be held for at least 30 seconds.  A stretch should never be painful. You should only feel a gentle lengthening or release in the stretched tissue. STRETCH - Flexion, Standing  Stand with good posture. With an underhand grip on your right / left hand, and an overhand grip on the opposite hand, grasp a broomstick or cane so that your hands are a little more than shoulder width apart.  Keeping your right / left elbow straight and shoulder muscles relaxed, push the stick with your opposite hand, to raise your right / left arm in front of your body and then overhead. Raise your arm until you feel a stretch in your right / left shoulder, but before you have increased shoulder pain.  Try to avoid shrugging your right / left shoulder as your arm rises, by keeping your shoulder blade tucked down and toward your mid-back spine. Hold for __________ seconds.  Slowly return to the starting position. Repeat __________ times. Complete this exercise __________ times per day. STRETCH - Abduction, Supine  Lie on your back. With an underhand grip on your right / left hand and an overhand grip on the opposite hand, grasp a broomstick or cane so that your hands are  a little more than shoulder width apart.  Keeping your right / left elbow straight and your shoulder muscles relaxed, push the stick with your opposite hand, to raise your right / left arm out to the side of your body and then overhead. Raise your arm until you feel a stretch in your right / left shoulder, but before you have increased shoulder pain.  Try to avoid shrugging your right / left shoulder as your arm rises, by keeping  your shoulder blade tucked down and toward your mid-back spine. Hold for __________ seconds.  Slowly return to the starting position. Repeat __________ times. Complete this exercise __________ times per day. ROM - Flexion, Active-Assisted  Lie on your back. You may bend your knees for comfort.  Grasp a broomstick or cane so your hands are about shoulder width apart. Your right / left hand should grip the end of the stick, so that your hand is positioned "thumbs-up," as if you were about to shake hands.  Using your healthy arm to lead, raise your right / left arm overhead, until you feel a gentle stretch in your shoulder. Hold for __________ seconds.  Use the stick to assist in returning your right / left arm to its starting position. Repeat __________ times. Complete this exercise __________ times per day.  ROM - Internal Rotation, Supine   Lie on your back on a firm surface. Place your right / left elbow about 60 degrees away from your side. Elevate your elbow with a folded towel, so that the elbow and shoulder are the same height.  Using a broomstick or cane and your strong arm, pull your right / left hand toward your body until you feel a gentle stretch, but no increase in your shoulder pain. Keep your shoulder and elbow in place throughout the exercise.  Hold for __________ seconds. Slowly return to the starting position. Repeat __________ times. Complete this exercise __________ times per day. STRETCH - Internal Rotation  Place your right / left hand behind your back, palm up.  Throw a towel or belt over your opposite shoulder. Grasp the towel with your right / left hand.  While keeping an upright posture, gently pull up on the towel, until you feel a stretch in the front of your right / left shoulder.  Avoid shrugging your right / left shoulder as your arm rises, by keeping your shoulder blade tucked down and toward your mid-back spine.  Hold for __________ seconds. Release the  stretch, by lowering your healthy hand. Repeat __________ times. Complete this exercise __________ times per day. ROM - Internal Rotation   Using an underhand grip, grasp a stick behind your back with both hands.  While standing upright with good posture, slide the stick up your back until you feel a mild stretch in the front of your shoulder.  Hold for __________ seconds. Slowly return to your starting position. Repeat __________ times. Complete this exercise __________ times per day.  STRETCH - Posterior Shoulder Capsule   Stand or sit with good posture. Grasp your right / left elbow and draw it across your chest, keeping it at the same height as your shoulder.  Pull your elbow, so your upper arm comes in closer to your chest. Pull until you feel a gentle stretch in the back of your shoulder.  Hold for __________ seconds. Repeat __________ times. Complete this exercise __________ times per day. STRENGTHENING EXERCISES - Impingement Syndrome (Rotator Cuff Tendinitis, Bursitis) These exercises may help you when beginning  to rehabilitate your injury. They may resolve your symptoms with or without further involvement from your physician, physical therapist or athletic trainer. While completing these exercises, remember:  Muscles can gain both the endurance and the strength needed for everyday activities through controlled exercises.  Complete these exercises as instructed by your physician, physical therapist or athletic trainer. Increase the resistance and repetitions only as guided.  You may experience muscle soreness or fatigue, but the pain or discomfort you are trying to eliminate should never worsen during these exercises. If this pain does get worse, stop and make sure you are following the directions exactly. If the pain is still present after adjustments, discontinue the exercise until you can discuss the trouble with your clinician.  During your recovery, avoid activity or  exercises which involve actions that place your injured hand or elbow above your head or behind your back or head. These positions stress the tissues which you are trying to heal. STRENGTH - Scapular Depression and Adduction   With good posture, sit on a firm chair. Support your arms in front of you, with pillows, arm rests, or on a table top. Have your elbows in line with the sides of your body.  Gently draw your shoulder blades down and toward your mid-back spine. Gradually increase the tension, without tensing the muscles along the top of your shoulders and the back of your neck.  Hold for __________ seconds. Slowly release the tension and relax your muscles completely before starting the next repetition.  After you have practiced this exercise, remove the arm support and complete the exercise in standing as well as sitting position. Repeat __________ times. Complete this exercise __________ times per day.  STRENGTH - Shoulder Abductors, Isometric  With good posture, stand or sit about 4-6 inches from a wall, with your right / left side facing the wall.  Bend your right / left elbow. Gently press your right / left elbow into the wall. Increase the pressure gradually, until you are pressing as hard as you can, without shrugging your shoulder or increasing any shoulder discomfort.  Hold for __________ seconds.  Release the tension slowly. Relax your shoulder muscles completely before you begin the next repetition. Repeat __________ times. Complete this exercise __________ times per day.  STRENGTH - External Rotators, Isometric  Keep your right / left elbow at your side and bend it 90 degrees.  Step into a door frame so that the outside of your right / left wrist can press against the door frame without your upper arm leaving your side.  Gently press your right / left wrist into the door frame, as if you were trying to swing the back of your hand away from your stomach. Gradually increase  the tension, until you are pressing as hard as you can, without shrugging your shoulder or increasing any shoulder discomfort.  Hold for __________ seconds.  Release the tension slowly. Relax your shoulder muscles completely before you begin the next repetition. Repeat __________ times. Complete this exercise __________ times per day.  STRENGTH - Supraspinatus   Stand or sit with good posture. Grasp a __________ weight, or an exercise band or tubing, so that your hand is "thumbs-up," like you are shaking hands.  Slowly lift your right / left arm in a "V" away from your thigh, diagonally into the space between your side and straight ahead. Lift your hand to shoulder height or as far as you can, without increasing any shoulder pain. At first, many people  do not lift their hands above shoulder height.  Avoid shrugging your right / left shoulder as your arm rises, by keeping your shoulder blade tucked down and toward your mid-back spine.  Hold for __________ seconds. Control the descent of your hand, as you slowly return to your starting position. Repeat __________ times. Complete this exercise __________ times per day.  STRENGTH - External Rotators  Secure a rubber exercise band or tubing to a fixed object (table, pole) so that it is at the same height as your right / left elbow when you are standing or sitting on a firm surface.  Stand or sit so that the secured exercise band is at your uninjured side.  Bend your right / left elbow 90 degrees. Place a folded towel or small pillow under your right / left arm, so that your elbow is a few inches away from your side.  Keeping the tension on the exercise band, pull it away from your body, as if pivoting on your elbow. Be sure to keep your body steady, so that the movement is coming only from your rotating shoulder.  Hold for __________ seconds. Release the tension in a controlled manner, as you return to the starting position. Repeat __________  times. Complete this exercise __________ times per day.  STRENGTH - Internal Rotators   Secure a rubber exercise band or tubing to a fixed object (table, pole) so that it is at the same height as your right / left elbow when you are standing or sitting on a firm surface.  Stand or sit so that the secured exercise band is at your right / left side.  Bend your elbow 90 degrees. Place a folded towel or small pillow under your right / left arm so that your elbow is a few inches away from your side.  Keeping the tension on the exercise band, pull it across your body, toward your stomach. Be sure to keep your body steady, so that the movement is coming only from your rotating shoulder.  Hold for __________ seconds. Release the tension in a controlled manner, as you return to the starting position. Repeat __________ times. Complete this exercise __________ times per day.  STRENGTH - Scapular Protractors, Standing   Stand arms length away from a wall. Place your hands on the wall, keeping your elbows straight.  Begin by dropping your shoulder blades down and toward your mid-back spine.  To strengthen your protractors, keep your shoulder blades down, but slide them forward on your rib cage. It will feel as if you are lifting the back of your rib cage away from the wall. This is a subtle motion and can be challenging to complete. Ask your caregiver for further instruction, if you are not sure you are doing the exercise correctly.  Hold for __________ seconds. Slowly return to the starting position, resting the muscles completely before starting the next repetition. Repeat __________ times. Complete this exercise __________ times per day. STRENGTH - Scapular Protractors, Supine  Lie on your back on a firm surface. Extend your right / left arm straight into the air while holding a __________ weight in your hand.  Keeping your head and back in place, lift your shoulder off the floor.  Hold for  __________ seconds. Slowly return to the starting position, and allow your muscles to relax completely before starting the next repetition. Repeat __________ times. Complete this exercise __________ times per day. STRENGTH - Scapular Protractors, Quadruped  Get onto your hands and knees,  with your shoulders directly over your hands (or as close as you can be, comfortably).  Keeping your elbows locked, lift the back of your rib cage up into your shoulder blades, so your mid-back rounds out. Keep your neck muscles relaxed.  Hold this position for __________ seconds. Slowly return to the starting position and allow your muscles to relax completely before starting the next repetition. Repeat __________ times. Complete this exercise __________ times per day.  STRENGTH - Scapular Retractors  Secure a rubber exercise band or tubing to a fixed object (table, pole), so that it is at the height of your shoulders when you are either standing, or sitting on a firm armless chair.  With a palm down grip, grasp an end of the band in each hand. Straighten your elbows and lift your hands straight in front of you, at shoulder height. Step back, away from the secured end of the band, until it becomes tense.  Squeezing your shoulder blades together, draw your elbows back toward your sides, as you bend them. Keep your upper arms lifted away from your body throughout the exercise.  Hold for __________ seconds. Slowly ease the tension on the band, as you reverse the directions and return to the starting position. Repeat __________ times. Complete this exercise __________ times per day. STRENGTH - Shoulder Extensors   Secure a rubber exercise band or tubing to a fixed object (table, pole) so that it is at the height of your shoulders when you are either standing, or sitting on a firm armless chair.  With a thumbs-up grip, grasp an end of the band in each hand. Straighten your elbows and lift your hands straight in  front of you, at shoulder height. Step back, away from the secured end of the band, until it becomes tense.  Squeezing your shoulder blades together, pull your hands down to the sides of your thighs. Do not allow your hands to go behind you.  Hold for __________ seconds. Slowly ease the tension on the band, as you reverse the directions and return to the starting position. Repeat __________ times. Complete this exercise __________ times per day.  STRENGTH - Scapular Retractors and External Rotators   Secure a rubber exercise band or tubing to a fixed object (table, pole) so that it is at the height as your shoulders, when you are either standing, or sitting on a firm armless chair.  With a palm down grip, grasp an end of the band in each hand. Bend your elbows 90 degrees and lift your elbows to shoulder height, at your sides. Step back, away from the secured end of the band, until it becomes tense.  Squeezing your shoulder blades together, rotate your shoulders so that your upper arms and elbows remain stationary, but your fists travel upward to head height.  Hold for __________ seconds. Slowly ease the tension on the band, as you reverse the directions and return to the starting position. Repeat __________ times. Complete this exercise __________ times per day.  STRENGTH - Scapular Retractors and External Rotators, Rowing   Secure a rubber exercise band or tubing to a fixed object (table, pole) so that it is at the height of your shoulders, when you are either standing, or sitting on a firm armless chair.  With a palm down grip, grasp an end of the band in each hand. Straighten your elbows and lift your hands straight in front of you, at shoulder height. Step back, away from the secured end of the  band, until it becomes tense.  Step 1: Squeeze your shoulder blades together. Bending your elbows, draw your hands to your chest, as if you are rowing a boat. At the end of this motion, your hands  and elbow should be at shoulder height and your elbows should be out to your sides.  Step 2: Rotate your shoulders, to raise your hands above your head. Your forearms should be vertical and your upper arms should be horizontal.  Hold for __________ seconds. Slowly ease the tension on the band, as you reverse the directions and return to the starting position. Repeat __________ times. Complete this exercise __________ times per day.  STRENGTH - Scapular Depressors  Find a sturdy chair without wheels, such as a dining room chair.  Keeping your feet on the floor, and your hands on the chair arms, lift your bottom up from the seat, and lock your elbows.  Keeping your elbows straight, allow gravity to pull your body weight down. Your shoulders will rise toward your ears.  Raise your body against gravity by drawing your shoulder blades down your back, shortening the distance between your shoulders and ears. Although your feet should always maintain contact with the floor, your feet should progressively support less body weight, as you get stronger.  Hold for __________ seconds. In a controlled and slow manner, lower your body weight to begin the next repetition. Repeat __________ times. Complete this exercise __________ times per day.    This information is not intended to replace advice given to you by your health care provider. Make sure you discuss any questions you have with your health care provider.   Document Released: 04/18/2005 Document Revised: 05/09/2014 Document Reviewed: 07/31/2008 Elsevier Interactive Patient Education [employer]

## 2015-10-30 NOTE — Assessment & Plan Note (Signed)
Symptoms and exam consistent with rotator cuff tear with questionable confirmation on the ultrasound versus anisoptry. Cortisone injection provided with post care instructions provided. Continue conservative treatment with ice, meloxicam, and Pennsaid. Follow up in 3 weeks or sooner if needed.

## 2015-11-04 ENCOUNTER — Ambulatory Visit: Payer: BLUE CROSS/BLUE SHIELD | Admitting: Physician Assistant

## 2015-11-04 DIAGNOSIS — Z0289 Encounter for other administrative examinations: Secondary | ICD-10-CM

## 2015-11-05 ENCOUNTER — Encounter: Payer: Self-pay | Admitting: Physician Assistant

## 2015-12-07 ENCOUNTER — Telehealth: Payer: Self-pay | Admitting: Physician Assistant

## 2015-12-07 NOTE — Telephone Encounter (Signed)
Patient was given requested Rx to pharmacy on 10/19/15, #30 tablets with [3] refills to [facility], which would cover through the time period of Sept-Oct, please have patient call his pharmacy to request available refill/SLS 08/07  Please have patient schedule F/U appointment prior to any future refill authorizations per provider/SLS 08/07 Thanks.

## 2015-12-07 NOTE — Telephone Encounter (Signed)
°  Relationship to patient: Self Can be reached: [phone]  Pharmacy:  [id number] - [place], [place] - [address] AT [facility] OF [address] [phone] (Phone) [phone] (Fax)     Reason for call: Request refill on valACYclovir (VALTREX) 1000 MG tablet [[id number]]

## 2015-12-08 NOTE — Telephone Encounter (Signed)
Informed patient of below. Patient states he will call the pharmacy to get a refill and call back to schedule F/U

## 2016-03-07 ENCOUNTER — Other Ambulatory Visit: Payer: Self-pay | Admitting: Physician Assistant

## 2016-03-07 NOTE — Telephone Encounter (Signed)
Rx request to pharmacy/SLS  

## 2016-05-20 ENCOUNTER — Ambulatory Visit (INDEPENDENT_AMBULATORY_CARE_PROVIDER_SITE_OTHER): Payer: BLUE CROSS/BLUE SHIELD | Admitting: Physician Assistant

## 2016-05-20 ENCOUNTER — Encounter: Payer: Self-pay | Admitting: Physician Assistant

## 2016-05-20 ENCOUNTER — Other Ambulatory Visit (HOSPITAL_COMMUNITY)
Admission: RE | Admit: 2016-05-20 | Discharge: 2016-05-20 | Disposition: A | Discharge status: Home / Self Care | Payer: BLUE CROSS/BLUE SHIELD | Source: Ambulatory Visit | Attending: Physician Assistant | Admitting: Physician Assistant

## 2016-05-20 VITALS — BP 120/88 | HR 61 | Temp 98.0°F | Resp 16 | Ht 67.0 in | Wt 201.0 lb

## 2016-05-20 DIAGNOSIS — Z113 Encounter for screening for infections with a predominantly sexual mode of transmission: Secondary | ICD-10-CM | POA: Diagnosis not present

## 2016-05-20 DIAGNOSIS — B009 Herpesviral infection, unspecified: Secondary | ICD-10-CM | POA: Insufficient documentation

## 2016-05-20 DIAGNOSIS — F5101 Primary insomnia: Secondary | ICD-10-CM | POA: Insufficient documentation

## 2016-05-20 MED ORDER — VALACYCLOVIR HCL 1 G PO TABS: 1000.0000 mg | ORAL_TABLET | Freq: Every day | ORAL | 1 refills | Status: DC

## 2016-05-20 MED ORDER — TRAZODONE HCL 50 MG PO TABS: 25.0000 mg | ORAL_TABLET | Freq: Every evening | ORAL | 1 refills | Status: DC | PRN

## 2016-05-20 NOTE — Assessment & Plan Note (Signed)
Continue Prophylactic Valtrex.

## 2016-05-20 NOTE — Assessment & Plan Note (Signed)
Discussed treatment options. Will start Trazodone 25 mg nightly PRN for sleep. FU 3 weeks for reassessment. Will do CPE at that time

## 2016-05-20 NOTE — Assessment & Plan Note (Signed)
Will check panel today. Safe sex practices reviewed with patient.

## 2016-05-20 NOTE — Progress Notes (Signed)
Pre visit review using our clinic review tool, if applicable. No additional management support is needed unless otherwise documented below in the visit note. 

## 2016-05-20 NOTE — Patient Instructions (Signed)
Please go to the lab for blood work. I have refilled your Valtrex. Restart as directed.  Please start the Trazodone as directed for sleep, taking 1/2 tablet.  Follow-up with me in 2-3 weeks. We will do your physical at that time if you would like.  You are overdue for this.

## 2016-05-20 NOTE — Progress Notes (Signed)
Patient presents to clinic today for follow-up.  Regarding mood, patient is doing well overall. Has had some episodes of anxiety but rare. Denies depressed mood. Denies SI/HI. Has been out of medication (Paxil and Klonopin) for quite some time. Notes biggest issue at present is insomnia. Notes difficulty with falling asleep and staying asleep.Marland Kitchen Has tried OTC Melatonin and Tylenol PM without much improvement in sleep. Has never taken Rx sleep medication.   Patient with history of genital herpes, recurrent. Is on Prophylactic Valtrex with good results. Denies side effect of medication. Denies recent outbreak. Is sexually active with one male partner. Denies concern for STI or symptoms of STI but requests routine.  Denies symptoms at present. Has checked yearly.    Past Medical History:  Diagnosis Date  . CTS (carpal tunnel syndrome)   . Gout   . HSV-2 (herpes simplex virus 2) infection   . Inguinal hernia     No current outpatient prescriptions on file prior to visit.   No current facility-administered medications on file prior to visit.    No Known Allergies  Family History  Problem Relation Age of Onset  . Hypertension Neg Hx   . Diabetes Neg Hx   . Breast cancer Maternal Grandmother   . Healthy Mother   . Suicidality Father     Deceased  . Hypertension Paternal Grandfather   . Hypertension Maternal Grandfather   . Diabetes Paternal Grandfather   . Diabetes Maternal Grandfather   . Heart disease Paternal Grandmother     Enlarged Heart  . Healthy Brother     x1 Half  . Healthy Sister     x2  . Healthy Daughter     x2  . Healthy Son     x1    Social History   Social History  . Marital status: Married    Spouse name: N/A  . Number of children: 3  . Years of education: N/A   Social History Main Topics  . Smoking status: Former Smoker    Packs/day: 1.00    Years: 15.00    Types: Cigarettes  . Smokeless tobacco: Former Neurosurgeon  . Alcohol use 0.6 oz/week    1  Standard drinks or equivalent per week     Comment: 2 drinks/month  . Drug use: Yes    Types: Marijuana     Comment: marijuana -- every day use  . Sexual activity: Not Currently    Partners: Female   Other Topics Concern  . None   Social History Narrative   ** Merged History Encounter **       Review of Systems - See HPI.  All other ROS are negative.  BP 120/88   Pulse 61   Temp 98 F (36.7 C) (Oral)   Resp 16   Ht 5\' 7"  (1.702 m)   Wt 201 lb (91.2 kg)   SpO2 97%   BMI 31.48 kg/m   Physical Exam  Constitutional: He is oriented to person, place, and time and well-developed, well-nourished, and in no distress.  HENT:  Head: Normocephalic and atraumatic.  Eyes: Conjunctivae are normal.  Neck: Neck supple.  Cardiovascular: Normal rate, regular rhythm, normal heart sounds and intact distal pulses.   Pulmonary/Chest: Effort normal and breath sounds normal. No respiratory distress. He has no wheezes. He has no rales. He exhibits no tenderness.  Neurological: He is alert and oriented to person, place, and time.  Skin: Skin is warm and dry. No rash noted.  Psychiatric:  Affect normal.  Vitals reviewed.  Assessment/Plan: Routine screening for STI (sexually transmitted infection) Will check panel today. Safe sex practices reviewed with patient.   Primary insomnia Discussed treatment options. Will start Trazodone 25 mg nightly PRN for sleep. FU 3 weeks for reassessment. Will do CPE at that time   HSV-2 (herpes simplex virus 2) infection Continue Prophylactic Valtrex.     Piedad Climes, PA-C

## 2016-05-21 LAB — HIV ANTIBODY (ROUTINE TESTING W REFLEX): HIV: NONREACTIVE

## 2016-05-21 LAB — RPR

## 2016-05-23 LAB — URINE CYTOLOGY ANCILLARY ONLY
Chlamydia: NEGATIVE
NEISSERIA GONORRHEA: NEGATIVE

## 2016-06-10 ENCOUNTER — Ambulatory Visit (INDEPENDENT_AMBULATORY_CARE_PROVIDER_SITE_OTHER): Payer: BLUE CROSS/BLUE SHIELD | Admitting: Physician Assistant

## 2016-06-10 ENCOUNTER — Encounter: Payer: Self-pay | Admitting: Physician Assistant

## 2016-06-10 VITALS — BP 128/82 | HR 63 | Temp 97.4°F | Resp 14 | Ht 67.0 in | Wt 202.0 lb

## 2016-06-10 DIAGNOSIS — Z0000 Encounter for general adult medical examination without abnormal findings: Secondary | ICD-10-CM | POA: Diagnosis not present

## 2016-06-10 DIAGNOSIS — F5101 Primary insomnia: Secondary | ICD-10-CM | POA: Diagnosis not present

## 2016-06-10 LAB — COMPREHENSIVE METABOLIC PANEL
ALBUMIN: 4.5 g/dL (ref 3.5–5.2)
ALK PHOS: 56 U/L (ref 39–117)
ALT: 33 U/L (ref 0–53)
AST: 20 U/L (ref 0–37)
BUN: 14 mg/dL (ref 6–23)
CALCIUM: 9.7 mg/dL (ref 8.4–10.5)
CHLORIDE: 105 meq/L (ref 96–112)
CO2: 29 mEq/L (ref 19–32)
CREATININE: 0.88 mg/dL (ref 0.40–1.50)
GFR: 104.74 mL/min (ref >60.00)
Glucose, Bld: 98 mg/dL (ref 70–99)
Potassium: 4.6 mEq/L (ref 3.5–5.1)
Sodium: 138 mEq/L (ref 135–145)
Total Bilirubin: 0.6 mg/dL (ref 0.2–1.2)
Total Protein: 7.1 g/dL (ref 6.0–8.3)

## 2016-06-10 LAB — LIPID PANEL
CHOLESTEROL: 160 mg/dL (ref 0–200)
HDL: 45.5 mg/dL (ref >39.00)
LDL Cholesterol: 98 mg/dL (ref 0–99)
NonHDL: 114.02
TRIGLYCERIDES: 80 mg/dL (ref 0.0–149.0)
Total CHOL/HDL Ratio: 4
VLDL: 16 mg/dL (ref 0.0–40.0)

## 2016-06-10 LAB — TSH: TSH: 1.01 u[IU]/mL (ref 0.35–4.50)

## 2016-06-10 LAB — URINALYSIS, ROUTINE W REFLEX MICROSCOPIC
Bilirubin Urine: NEGATIVE
Hgb urine dipstick: NEGATIVE
Ketones, ur: NEGATIVE
Leukocytes, UA: NEGATIVE
NITRITE: NEGATIVE
SPECIFIC GRAVITY, URINE: 1.025 (ref 1.000–1.030)
TOTAL PROTEIN, URINE-UPE24: NEGATIVE
URINE GLUCOSE: NEGATIVE
UROBILINOGEN UA: 0.2 (ref 0.0–1.0)
pH: 6 (ref 5.0–8.0)

## 2016-06-10 LAB — HEMOGLOBIN A1C: Hgb A1c MFr Bld: 5.2 % (ref 4.6–6.5)

## 2016-06-10 LAB — CBC
HCT: 51.2 % (ref 39.0–52.0)
Hemoglobin: 17.4 g/dL — ABNORMAL HIGH (ref 13.0–17.0)
MCHC: 33.9 g/dL (ref 30.0–36.0)
MCV: 93.9 fl (ref 78.0–100.0)
PLATELETS: 286 10*3/uL (ref 150.0–400.0)
RBC: 5.46 Mil/uL (ref 4.22–5.81)
RDW: 13.2 % (ref 11.5–15.5)
WBC: 5.9 10*3/uL (ref 4.0–10.5)

## 2016-06-10 MED ORDER — FLUTICASONE PROPIONATE 50 MCG/ACT NA SUSP: 2.0000 | Freq: Every day | NASAL | 6 refills | Status: DC

## 2016-06-10 NOTE — Patient Instructions (Signed)
Please go to the lab for blood work.   Our office will call you with your results unless you have chosen to receive results via MyChart.  If your blood work is normal we will follow-up each year for physicals and as scheduled for chronic medical problems.  If anything is abnormal we will treat accordingly and get you in for a follow-up.  Please work on meal planning.  Increase aerobic exercise to a goal of 150 minutes per week.   Preventive Care 18-39 Years, Male Preventive care refers to lifestyle choices and visits with your health care provider that can promote health and wellness. What does preventive care include?  A yearly physical exam. This is also called an annual well check.  Dental exams once or twice a year.  Routine eye exams. Ask your health care provider how often you should have your eyes checked.  Personal lifestyle choices, including:  Daily care of your teeth and gums.  Regular physical activity.  Eating a healthy diet.  Avoiding tobacco and drug use.  Limiting alcohol use.  Practicing safe sex. What happens during an annual well check? The services and screenings done by your health care provider during your annual well check will depend on your age, overall health, lifestyle risk factors, and family history of disease. Counseling  Your health care provider may ask you questions about your:  Alcohol use.  Tobacco use.  Drug use.  Emotional well-being.  Home and relationship well-being.  Sexual activity.  Eating habits.  Work and work Statistician. Screening  You may have the following tests or measurements:  Height, weight, and BMI.  Blood pressure.  Lipid and cholesterol levels. These may be checked every 5 years starting at age 60.  Diabetes screening. This is done by checking your blood sugar (glucose) after you have not eaten for a while (fasting).  Skin check.  Hepatitis C blood test.  Hepatitis B blood test.  Sexually  transmitted disease (STD) testing. Discuss your test results, treatment options, and if necessary, the need for more tests with your health care provider. Vaccines  Your health care provider may recommend certain vaccines, such as:  Influenza vaccine. This is recommended every year.  Tetanus, diphtheria, and acellular pertussis (Tdap, Td) vaccine. You may need a Td booster every 10 years.  Varicella vaccine. You may need this if you have not been vaccinated.  HPV vaccine. If you are 40 or younger, you may need three doses over 6 months.  Measles, mumps, and rubella (MMR) vaccine. You may need at least one dose of MMR.You may also need a second dose.  Pneumococcal 13-valent conjugate (PCV13) vaccine. You may need this if you have certain conditions and have not been vaccinated.  Pneumococcal polysaccharide (PPSV23) vaccine. You may need one or two doses if you smoke cigarettes or if you have certain conditions.  Meningococcal vaccine. One dose is recommended if you are age 82-21 years and a first-year college student living in a residence hall, or if you have one of several medical conditions. You may also need additional booster doses.  Hepatitis A vaccine. You may need this if you have certain conditions or if you travel or work in places where you may be exposed to hepatitis A.  Hepatitis B vaccine. You may need this if you have certain conditions or if you travel or work in places where you may be exposed to hepatitis B.  Haemophilus influenzae type b (Hib) vaccine. You may need this if you  have certain risk factors. Talk to your health care provider about which screenings and vaccines you need and how often you need them. This information is not intended to replace advice given to you by your health care provider. Make sure you discuss any questions you have with your health care provider. Document Released: 06/14/2001 Document Revised: 01/06/2016 Document Reviewed:  02/17/2015 Elsevier Interactive Patient Education  2017 [employer].

## 2016-06-10 NOTE — Assessment & Plan Note (Signed)
Much improved with Trazodone. Continue as directed.

## 2016-06-10 NOTE — Progress Notes (Signed)
Patient presents to clinic today for annual exam.  Patient is fasting for labs. Body mass index is 31.64 kg/m. -- Stays active at work. Working 11 hours per day. Makes home exercise harder. Endorses watching portion sizes. Does sometimes eat fast foods.   Chronic Issues: Insomnia -- Taking Trazodone at 8:30 at night. Endorses sleep improved from 3 hours per night to 6 hours.   Health Maintenance: Immunizations -- Declines flu shot. Tetanus up-to-date.   Past Medical History:  Diagnosis Date  . CTS (carpal tunnel syndrome)   . Gout   . HSV-2 (herpes simplex virus 2) infection   . Inguinal hernia     Past Surgical History:  Procedure Laterality Date  . BONE SPUR     Left Foot  . CARPAL TUNNEL RELEASE Left   . CARPAL TUNNEL RELEASE     Left Hand  . HERNIA REPAIR    . TOE SURGERY Right     Current Outpatient Prescriptions on File Prior to Visit  Medication Sig Dispense Refill  . traZODone (DESYREL) 50 MG tablet Take 0.5 tablets (25 mg total) by mouth at bedtime as needed for sleep. 30 tablet 1  . valACYclovir (VALTREX) 1000 MG tablet Take 1 tablet (1,000 mg total) by mouth daily. 30 tablet 1   No current facility-administered medications on file prior to visit.     No Known Allergies  Family History  Problem Relation Age of Onset  . Hypertension Neg Hx   . Diabetes Neg Hx   . Breast cancer Maternal Grandmother   . Healthy Mother   . Suicidality Father     Deceased  . Hypertension Paternal Grandfather   . Hypertension Maternal Grandfather   . Diabetes Paternal Grandfather   . Diabetes Maternal Grandfather   . Heart disease Paternal Grandmother     Enlarged Heart  . Healthy Brother     x1 Half  . Healthy Sister     x2  . Healthy Daughter     x2  . Healthy Son     x1    Social History   Social History  . Marital status: Married    Spouse name: N/A  . Number of children: 3  . Years of education: N/A   Occupational History  . Not on file.    Social History Main Topics  . Smoking status: Former Smoker    Packs/day: 1.00    Years: 15.00    Types: Cigarettes  . Smokeless tobacco: Former Neurosurgeon  . Alcohol use 0.6 oz/week    1 Standard drinks or equivalent per week     Comment: 2 drinks/month  . Drug use: Yes    Types: Marijuana     Comment: marijuana -- every day use  . Sexual activity: Not Currently    Partners: Female   Other Topics Concern  . Not on file   Social History Narrative   ** Merged History Encounter **       Review of Systems  Constitutional: Negative for fever and weight loss.  HENT: Negative for ear discharge, ear pain, hearing loss and tinnitus.   Eyes: Negative for blurred vision, double vision, photophobia and pain.  Respiratory: Negative for cough and shortness of breath.   Cardiovascular: Negative for chest pain and palpitations.  Gastrointestinal: Negative for abdominal pain, blood in stool, constipation, diarrhea, heartburn, melena, nausea and vomiting.  Genitourinary: Negative for dysuria, flank pain, frequency, hematuria and urgency.  Musculoskeletal: Negative for falls.  Neurological: Negative for  dizziness, loss of consciousness and headaches.  Endo/Heme/Allergies: Negative for environmental allergies.  Psychiatric/Behavioral: Negative for depression, hallucinations, substance abuse and suicidal ideas. The patient has insomnia. The patient is not nervous/anxious.    BP 128/82   Pulse 63   Temp 97.4 F (36.3 C) (Oral)   Resp 14   Ht 5\' 7"  (1.702 m)   Wt 202 lb (91.6 kg)   SpO2 98%   BMI 31.64 kg/m   Physical Exam  Constitutional: He is oriented to person, place, and time and well-developed, well-nourished, and in no distress.  HENT:  Head: Normocephalic and atraumatic.  Right Ear: External ear normal.  Left Ear: External ear normal.  Nose: Nose normal.  Mouth/Throat: Oropharynx is clear and moist. No oropharyngeal exudate.  Eyes: Conjunctivae and EOM are normal. Pupils are  equal, round, and reactive to light.  Neck: Neck supple. No thyromegaly present.  Cardiovascular: Normal rate, regular rhythm, normal heart sounds and intact distal pulses.   Pulmonary/Chest: Effort normal and breath sounds normal. No respiratory distress. He has no wheezes. He has no rales. He exhibits no tenderness.  Abdominal: Soft. Bowel sounds are normal. He exhibits no distension and no mass. There is no tenderness. There is no rebound and no guarding.  Genitourinary: Testes/scrotum normal and penis normal. No discharge found.  Lymphadenopathy:    He has no cervical adenopathy.  Neurological: He is alert and oriented to person, place, and time.  Skin: Skin is warm and dry. No rash noted.  Psychiatric: Affect normal.  Vitals reviewed.  Recent Results (from the past 2160 hour(s))  Urine cytology ancillary only     Status: None   Collection Time: 05/20/16 12:00 AM  Result Value Ref Range   Chlamydia Negative     Comment: Normal Reference Range - Negative   Neisseria gonorrhea Negative     Comment: Normal Reference Range - Negative  HIV antibody     Status: None   Collection Time: 05/20/16  1:36 PM  Result Value Ref Range   HIV 1&2 Ab, 4th Generation NONREACTIVE NONREACTIVE    Comment:   HIV-1 antigen and HIV-1/HIV-2 antibodies were not detected.  There is no laboratory evidence of HIV infection.   HIV-1/2 Antibody Diff        Not indicated. HIV-1 RNA, Qual TMA          Not indicated.     PLEASE NOTE: This information has been disclosed to you from records whose confidentiality may be protected by state law. If your state requires such protection, then the state law prohibits you from making any further disclosure of the information without the specific written consent of the person to whom it pertains, or as otherwise permitted by law. A general authorization for the release of medical or other information is NOT sufficient for this purpose.   The performance of this assay  has not been clinically validated in patients less than 24 years old.   For additional information please refer to http://education.quest[employer].com/faq/FAQ106.  (This link is being provided for informational/educational purposes only.)     RPR     Status: None   Collection Time: 05/20/16  1:36 PM  Result Value Ref Range   RPR Ser Ql NON REAC NON REAC    Assessment/Plan: Visit for preventive health examination Depression screen negative. Health Maintenance reviewed -- Declines flu shot. Tetanus up-to-date. HIV screen complete. Preventive schedule discussed and handout given in AVS. Will obtain fasting labs today.   Primary insomnia Much improved  with Trazodone. Continue as directed.    Piedad Climes, PA-C

## 2016-06-10 NOTE — Assessment & Plan Note (Signed)
Depression screen negative. Health Maintenance reviewed -- Declines flu shot. Tetanus up-to-date. HIV screen complete.. Preventive schedule discussed and handout given in AVS. Will obtain fasting labs today.  

## 2016-06-10 NOTE — Progress Notes (Signed)
Pre visit review using our clinic review tool, if applicable. No additional management support is needed unless otherwise documented below in the visit note. 

## 2016-06-13 ENCOUNTER — Other Ambulatory Visit: Payer: Self-pay | Admitting: Physician Assistant

## 2016-06-13 DIAGNOSIS — R8290 Unspecified abnormal findings in urine: Secondary | ICD-10-CM

## 2016-06-13 DIAGNOSIS — R7989 Other specified abnormal findings of blood chemistry: Secondary | ICD-10-CM

## 2016-06-20 ENCOUNTER — Other Ambulatory Visit (INDEPENDENT_AMBULATORY_CARE_PROVIDER_SITE_OTHER): Payer: BLUE CROSS/BLUE SHIELD

## 2016-06-20 DIAGNOSIS — R8290 Unspecified abnormal findings in urine: Secondary | ICD-10-CM | POA: Diagnosis not present

## 2016-06-20 DIAGNOSIS — R7989 Other specified abnormal findings of blood chemistry: Secondary | ICD-10-CM | POA: Diagnosis not present

## 2016-06-20 LAB — CBC WITH DIFFERENTIAL/PLATELET
BASOS PCT: 1 % (ref 0.0–3.0)
Basophils Absolute: 0.1 10*3/uL (ref 0.0–0.1)
EOS ABS: 0.3 10*3/uL (ref 0.0–0.7)
EOS PCT: 5.9 % — AB (ref 0.0–5.0)
HEMATOCRIT: 50.5 % (ref 39.0–52.0)
Hemoglobin: 17.2 g/dL — ABNORMAL HIGH (ref 13.0–17.0)
Lymphocytes Relative: 37.8 % (ref 12.0–46.0)
Lymphs Abs: 2 10*3/uL (ref 0.7–4.0)
MCHC: 34 g/dL (ref 30.0–36.0)
MCV: 93.7 fl (ref 78.0–100.0)
MONO ABS: 0.5 10*3/uL (ref 0.1–1.0)
Monocytes Relative: 8.4 % (ref 3.0–12.0)
NEUTROS ABS: 2.5 10*3/uL (ref 1.4–7.7)
Neutrophils Relative %: 46.9 % (ref 43.0–77.0)
PLATELETS: 266 10*3/uL (ref 150.0–400.0)
RBC: 5.39 Mil/uL (ref 4.22–5.81)
RDW: 13.4 % (ref 11.5–15.5)
WBC: 5.4 10*3/uL (ref 4.0–10.5)

## 2016-06-20 LAB — URINALYSIS, MICROSCOPIC ONLY: RBC / HPF: NONE SEEN (ref 0–?)

## 2016-06-21 ENCOUNTER — Other Ambulatory Visit: Payer: Self-pay | Admitting: Physician Assistant

## 2016-06-21 DIAGNOSIS — D582 Other hemoglobinopathies: Secondary | ICD-10-CM

## 2016-07-19 ENCOUNTER — Telehealth: Payer: Self-pay | Admitting: Physician Assistant

## 2016-07-19 DIAGNOSIS — B009 Herpesviral infection, unspecified: Secondary | ICD-10-CM

## 2016-07-19 NOTE — Telephone Encounter (Signed)
Patient requesting refill of valACYclovir (VALTREX) 1000 MG tablet.  Pharmacy: [id number] - [place], [place] - [address] AT [address] OF [address] [phone] (Phone) [phone] (Fax)

## 2016-07-20 MED ORDER — VALACYCLOVIR HCL 1 G PO TABS: 1000.0000 mg | ORAL_TABLET | Freq: Every day | ORAL | 2 refills | Status: DC

## 2016-07-20 NOTE — Telephone Encounter (Signed)
Patient notifed rx sent to pharmacy as requested.

## 2016-07-20 NOTE — Telephone Encounter (Signed)
Refill has been sent.  °

## 2016-11-08 ENCOUNTER — Other Ambulatory Visit: Payer: Self-pay | Admitting: Physician Assistant

## 2016-11-08 DIAGNOSIS — B009 Herpesviral infection, unspecified: Secondary | ICD-10-CM

## 2016-12-23 ENCOUNTER — Ambulatory Visit (INDEPENDENT_AMBULATORY_CARE_PROVIDER_SITE_OTHER): Payer: BLUE CROSS/BLUE SHIELD | Admitting: Physician Assistant

## 2016-12-23 ENCOUNTER — Encounter: Payer: Self-pay | Admitting: Physician Assistant

## 2016-12-23 VITALS — BP 130/84 | HR 64 | Temp 97.9°F | Resp 14 | Ht 67.0 in | Wt 203.0 lb

## 2016-12-23 DIAGNOSIS — F17200 Nicotine dependence, unspecified, uncomplicated: Secondary | ICD-10-CM

## 2016-12-23 DIAGNOSIS — M7701 Medial epicondylitis, right elbow: Secondary | ICD-10-CM | POA: Diagnosis not present

## 2016-12-23 DIAGNOSIS — G4700 Insomnia, unspecified: Secondary | ICD-10-CM | POA: Diagnosis not present

## 2016-12-23 MED ORDER — ZOLPIDEM TARTRATE 5 MG PO TABS: 5.0000 mg | ORAL_TABLET | Freq: Every evening | ORAL | 0 refills | Status: DC | PRN

## 2016-12-23 MED ORDER — MELOXICAM 15 MG PO TABS: 15.0000 mg | ORAL_TABLET | Freq: Every day | ORAL | 0 refills | Status: DC

## 2016-12-23 MED ORDER — BUPROPION HCL ER (SR) 150 MG PO TB12: 150.0000 mg | ORAL_TABLET | Freq: Two times a day (BID) | ORAL | 0 refills | Status: DC

## 2016-12-23 NOTE — Progress Notes (Signed)
Patient presents to clinic today c/o pain in R elbow that has been present intermittently over the past 1.5 months but more consistent over the past week. Denies recent trauma or injury. Pain is aching and burning in nature, sometimes extending to proximal forearm. Denies redness, swelling, numbness or tingling or extremity. Has not taken anything for symptoms.   Patient would also like to discuss other options for insomnia. Is currently on Trazodone at initial 25 mg nightly dose. Noted initial improvement with medication but that it stopped working. States he increased to 50 mg and did not note improvement but noted nausea.   Would also like to discuss pharmacotherapy for smoking cessation. Wants to avoid Chantix due to "stories" he has heard concerning medication side effects.   Past Medical History:  Diagnosis Date  . CTS (carpal tunnel syndrome)   . Gout   . HSV-2 (herpes simplex virus 2) infection   . Inguinal hernia     Current Outpatient Prescriptions on File Prior to Visit  Medication Sig Dispense Refill  . valACYclovir (VALTREX) 1000 MG tablet TAKE 1 TABLET( 1,000 MG TOTAL) BY MOUTH DAILY 90 tablet 0   No current facility-administered medications on file prior to visit.     No Known Allergies  Family History  Problem Relation Age of Onset  . Hypertension Neg Hx   . Diabetes Neg Hx   . Breast cancer Maternal Grandmother   . Healthy Mother   . Suicidality Father        Deceased  . Hypertension Paternal Grandfather   . Hypertension Maternal Grandfather   . Diabetes Paternal Grandfather   . Diabetes Maternal Grandfather   . Heart disease Paternal Grandmother        Enlarged Heart  . Healthy Brother        x1 Half  . Healthy Sister        x2  . Healthy Daughter        x2  . Healthy Son        x1    Social History   Social History  . Marital status: Married    Spouse name: N/A  . Number of children: 3  . Years of education: N/A   Social History Main  Topics  . Smoking status: Former Smoker    Packs/day: 1.00    Years: 15.00    Types: Cigarettes  . Smokeless tobacco: Former Neurosurgeon  . Alcohol use 0.6 oz/week    1 Standard drinks or equivalent per week     Comment: 2 drinks/month  . Drug use: Yes    Types: Marijuana     Comment: marijuana -- every day use  . Sexual activity: Not Currently    Partners: Female   Other Topics Concern  . None   Social History Narrative   ** Merged History Encounter **       Review of Systems - See HPI.  All other ROS are negative.  BP 130/84   Pulse 64   Temp 97.9 F (36.6 C) (Oral)   Resp 14   Ht 5\' 7"  (1.702 m)   Wt 203 lb (92.1 kg)   SpO2 97%   BMI 31.79 kg/m   Physical Exam  Constitutional: He is oriented to person, place, and time and well-developed, well-nourished, and in no distress.  HENT:  Head: Normocephalic and atraumatic.  Eyes: Conjunctivae are normal.  Neck: Neck supple.  Cardiovascular: Normal rate, regular rhythm, normal heart sounds and intact distal pulses.  Pulmonary/Chest: Effort normal and breath sounds normal. No respiratory distress. He has no wheezes. He has no rales. He exhibits no tenderness.  Musculoskeletal:       Right elbow: He exhibits normal range of motion, no swelling and no deformity. Tenderness found. Medial epicondyle tenderness noted. No radial head, no lateral epicondyle and no olecranon process tenderness noted.       Right wrist: Normal.       Right forearm: Normal.       Right hand: Normal.  Neurological: He is alert and oriented to person, place, and time.  Skin: Skin is warm and dry. No rash noted.  Psychiatric: Affect normal.  Vitals reviewed.  Assessment/Plan: Medial epicondylitis of elbow, right Start bracing. Rx mobic. RICE therapy. If not improving over the next week or so, will need imaging and potential sports medicine assessment.   Tobacco use disorder Discussed treatment options. Patient is ready to quit as he is unhappy he  resumed smoking. Patient elects to start trial of Wellbutrin. Medication sent in. Follow-up discussed.   Insomnia Stop Trazodone. Sleep Hygiene reviewed. Will start trial of Ambien 5 mg nightly.     Piedad Climes, PA-C

## 2016-12-23 NOTE — Patient Instructions (Signed)
Please start the Wellbutrin -- Take one tablet daily x 3 days. Then increase to one tablet twice daily. This is for smoking cessation.   For the insomnia -- take the ambien nightly as directed.   For the Elbow -- please get an elbow brace at the pharmacy. Take meloxicam once daily with food over next 7-10 days.  No ibuprofen while on this medication. No heavy lifting. If not improving will need imaging.

## 2016-12-23 NOTE — Progress Notes (Signed)
Pre visit review using our clinic review tool, if applicable. No additional management support is needed unless otherwise documented below in the visit note. 

## 2016-12-24 DIAGNOSIS — M7701 Medial epicondylitis, right elbow: Secondary | ICD-10-CM | POA: Insufficient documentation

## 2016-12-24 DIAGNOSIS — F17200 Nicotine dependence, unspecified, uncomplicated: Secondary | ICD-10-CM | POA: Insufficient documentation

## 2016-12-24 DIAGNOSIS — G4700 Insomnia, unspecified: Secondary | ICD-10-CM | POA: Insufficient documentation

## 2016-12-24 NOTE — Assessment & Plan Note (Signed)
Stop Trazodone. Sleep Hygiene reviewed. Will start trial of Ambien 5 mg nightly.

## 2016-12-24 NOTE — Assessment & Plan Note (Signed)
Start bracing. Rx mobic. RICE therapy. If not improving over the next week or so, will need imaging and potential sports medicine assessment.

## 2016-12-24 NOTE — Assessment & Plan Note (Signed)
Discussed treatment options. Patient is ready to quit as he is unhappy he resumed smoking. Patient elects to start trial of Wellbutrin. Medication sent in. Follow-up discussed.

## 2017-01-23 ENCOUNTER — Ambulatory Visit (INDEPENDENT_AMBULATORY_CARE_PROVIDER_SITE_OTHER): Payer: BLUE CROSS/BLUE SHIELD | Admitting: Physician Assistant

## 2017-01-23 ENCOUNTER — Encounter: Payer: Self-pay | Admitting: Physician Assistant

## 2017-01-23 VITALS — BP 130/90 | HR 71 | Temp 98.3°F | Resp 14 | Ht 67.0 in | Wt 205.0 lb

## 2017-01-23 DIAGNOSIS — Z23 Encounter for immunization: Secondary | ICD-10-CM

## 2017-01-23 DIAGNOSIS — M545 Low back pain, unspecified: Secondary | ICD-10-CM

## 2017-01-23 LAB — POCT URINALYSIS DIPSTICK
Bilirubin, UA: NEGATIVE
Blood, UA: NEGATIVE
GLUCOSE UA: NEGATIVE
Ketones, UA: NEGATIVE
LEUKOCYTES UA: NEGATIVE
NITRITE UA: NEGATIVE
Protein, UA: NEGATIVE
Spec Grav, UA: <=1.005 — AB (ref 1.010–1.025)
Urobilinogen, UA: 0.2 E.U./dL
pH, UA: 7 (ref 5.0–8.0)

## 2017-01-23 MED ORDER — CYCLOBENZAPRINE HCL 10 MG PO TABS: 10.0000 mg | ORAL_TABLET | Freq: Three times a day (TID) | ORAL | 0 refills | Status: DC | PRN

## 2017-01-23 NOTE — Progress Notes (Signed)
Pre visit review using our clinic review tool, if applicable. No additional management support is needed unless otherwise documented below in the visit note. 

## 2017-01-23 NOTE — Progress Notes (Signed)
Patient presents to clinic today c/o 2 weeks of midline lower back pain, first noted in the middle of the night. Is worse in the morning but is constant. Is worse with ambulating and sitting. Alleviated somewhat with standing. Denies radiation into lower extremities. Denies change to urination, bowel habits. Denies saddle anesthesia. Has taken Meloxicam with little relief of symptoms. Denies known trauma or injury to back.   Past Medical History:  Diagnosis Date  . CTS (carpal tunnel syndrome)   . Gout   . HSV-2 (herpes simplex virus 2) infection   . Inguinal hernia     Current Outpatient Prescriptions on File Prior to Visit  Medication Sig Dispense Refill  . buPROPion (WELLBUTRIN SR) 150 MG 12 hr tablet Take 1 tablet (150 mg total) by mouth 2 (two) times daily. 60 tablet 0  . meloxicam (MOBIC) 15 MG tablet Take 1 tablet (15 mg total) by mouth daily. 15 tablet 0  . valACYclovir (VALTREX) 1000 MG tablet TAKE 1 TABLET( 1,000 MG TOTAL) BY MOUTH DAILY 90 tablet 0  . zolpidem (AMBIEN) 5 MG tablet Take 1 tablet (5 mg total) by mouth at bedtime as needed for sleep. 30 tablet 0   No current facility-administered medications on file prior to visit.     No Known Allergies  Family History  Problem Relation Age of Onset  . Hypertension Neg Hx   . Diabetes Neg Hx   . Breast cancer Maternal Grandmother   . Healthy Mother   . Suicidality Father        Deceased  . Hypertension Paternal Grandfather   . Hypertension Maternal Grandfather   . Diabetes Paternal Grandfather   . Diabetes Maternal Grandfather   . Heart disease Paternal Grandmother        Enlarged Heart  . Healthy Brother        x1 Half  . Healthy Sister        x2  . Healthy Daughter        x2  . Healthy Son        x1    Social History   Social History  . Marital status: Married    Spouse name: N/A  . Number of children: 3  . Years of education: N/A   Social History Main Topics  . Smoking status: Former Smoker   Packs/day: 1.00    Years: 15.00    Types: Cigarettes  . Smokeless tobacco: Former Neurosurgeon  . Alcohol use 0.6 oz/week    1 Standard drinks or equivalent per week     Comment: 2 drinks/month  . Drug use: Yes    Types: Marijuana     Comment: marijuana -- every day use  . Sexual activity: Not Currently    Partners: Female   Other Topics Concern  . None   Social History Narrative   ** Merged History Encounter **       Review of Systems - See HPI.  All other ROS are negative.  BP 130/90   Pulse 71   Temp 98.3 F (36.8 C) (Oral)   Resp 14   Ht 5\' 7"  (1.702 m)   Wt 205 lb (93 kg)   SpO2 98%   BMI 32.11 kg/m   Physical Exam  Constitutional: He is oriented to person, place, and time and well-developed, well-nourished, and in no distress.  HENT:  Head: Normocephalic and atraumatic.  Eyes: Conjunctivae are normal.  Neck: Neck supple.  Cardiovascular: Normal rate, regular rhythm, normal heart sounds  and intact distal pulses.   Pulmonary/Chest: Effort normal and breath sounds normal. No respiratory distress. He has no wheezes. He has no rales. He exhibits no tenderness.  Neurological: He is alert and oriented to person, place, and time.  Skin: Skin is warm and dry. No rash noted.  Psychiatric: Affect normal.  Vitals reviewed.  Assessment/Plan: 1. Acute bilateral low back pain without sciatica No bony tenderness. Pain with ROM -- lateral bending, rotation or torso, flexion and extension. Atraumatic. Continue Mobic. Add on-tylenol. Start Flexeril for muscle spasms. Stretches reviewed. - POCT Urinalysis Dipstick  2. Need for immunization against influenza Flu shot given today. - Flu Vaccine QUAD 36+ mos IM   Piedad Climes, PA-C

## 2017-01-23 NOTE — Patient Instructions (Signed)
Please avoid heavy lifting or overexertion. Start the Flexeril -- you can take at night. You can take up to three times daily but cannot drive while on medication. Continue Meloxicam once daily.  Add on tylenol later in the day for breakthrough pain.  If symptoms are not improving over the week or new symptoms develop, we will need to get imaging.

## 2017-03-09 ENCOUNTER — Other Ambulatory Visit: Payer: Self-pay | Admitting: Physician Assistant

## 2017-03-09 DIAGNOSIS — B009 Herpesviral infection, unspecified: Secondary | ICD-10-CM

## 2017-10-02 ENCOUNTER — Other Ambulatory Visit: Payer: Self-pay | Admitting: Physician Assistant

## 2017-10-02 DIAGNOSIS — B009 Herpesviral infection, unspecified: Secondary | ICD-10-CM

## 2017-11-16 ENCOUNTER — Telehealth: Payer: Self-pay | Admitting: Emergency Medicine

## 2017-11-16 NOTE — Telephone Encounter (Signed)
Spoke with patient about the reason for the steroid injection. He states he had bicep tendon tear. Is having difficulty in the mornings with moving in both biceps. Having pain from elbows to shoulders. He does not see Sports Med or Ortho.  Scheduled appt for Monday with PCP for evaluation and next steps.  Copied from CRM [id number]. Topic: General - Other >> Nov 16, 2017  9:54 AM Trula Slade wrote: Reason for CRM:   Patient would like to know if his provider, Marcelline Mates, gives out Cortisone Injections.  Please advise.  If no answer with cell phone, please call his work number.

## 2017-11-20 ENCOUNTER — Ambulatory Visit (INDEPENDENT_AMBULATORY_CARE_PROVIDER_SITE_OTHER): Payer: BLUE CROSS/BLUE SHIELD

## 2017-11-20 ENCOUNTER — Other Ambulatory Visit: Payer: Self-pay | Admitting: Physician Assistant

## 2017-11-20 ENCOUNTER — Encounter: Payer: Self-pay | Admitting: Physician Assistant

## 2017-11-20 ENCOUNTER — Ambulatory Visit: Payer: BLUE CROSS/BLUE SHIELD | Admitting: Physician Assistant

## 2017-11-20 ENCOUNTER — Other Ambulatory Visit: Payer: Self-pay

## 2017-11-20 VITALS — BP 124/88 | HR 59 | Temp 97.6°F | Resp 16 | Ht 67.0 in | Wt 202.0 lb

## 2017-11-20 DIAGNOSIS — G8929 Other chronic pain: Secondary | ICD-10-CM

## 2017-11-20 DIAGNOSIS — M25512 Pain in left shoulder: Secondary | ICD-10-CM | POA: Diagnosis not present

## 2017-11-20 DIAGNOSIS — M5412 Radiculopathy, cervical region: Secondary | ICD-10-CM

## 2017-11-20 MED ORDER — CYCLOBENZAPRINE HCL 10 MG PO TABS: 10.0000 mg | ORAL_TABLET | Freq: Every day | ORAL | 0 refills | Status: DC

## 2017-11-20 MED ORDER — METHYLPREDNISOLONE 4 MG PO TBPK: ORAL_TABLET | ORAL | 0 refills | Status: DC

## 2017-11-20 NOTE — Progress Notes (Signed)
Patient presents to clinic today c/o chronic and worsening pain of left shoulder. Patient with remote history of left biceps tendon rupture (5 years prior). Notes morning stiffness in left shoulder along with pain with certain ROM of shoulder. Denies any noted weakness or inability to move shoulder, but notes pain with abduction > 90 degrees and rotation of shoulder. Denies any known trauma or injury. Denies numbness, tingling or weakness of extremity. Pain is aching in nature and associated with neck tension. He is weightlifting at the gym several times per week.   Patient also notes some R-sided shoulder pain that radiated to his hand over the past couple of weeks. Denies trauma or injury. Denies numbness, tingling or weakness of extremity.  Past Medical History:  Diagnosis Date  . CTS (carpal tunnel syndrome)   . Gout   . HSV-2 (herpes simplex virus 2) infection   . Inguinal hernia     Current Outpatient Medications on File Prior to Visit  Medication Sig Dispense Refill  . valACYclovir (VALTREX) 1000 MG tablet TAKE 1 TABLET BY MOUTH DAILY 90 tablet 0   No current facility-administered medications on file prior to visit.     No Known Allergies  Family History  Problem Relation Age of Onset  . Hypertension Neg Hx   . Diabetes Neg Hx   . Breast cancer Maternal Grandmother   . Healthy Mother   . Suicidality Father        Deceased  . Hypertension Paternal Grandfather   . Hypertension Maternal Grandfather   . Diabetes Paternal Grandfather   . Diabetes Maternal Grandfather   . Heart disease Paternal Grandmother        Enlarged Heart  . Healthy Brother        x1 Half  . Healthy Sister        x2  . Healthy Daughter        x2  . Healthy Son        x1    Social History   Socioeconomic History  . Marital status: Married    Spouse name: Not on file  . Number of children: 3  . Years of education: Not on file  . Highest education level: Not on file  Occupational History    . Not on file  Social Needs  . Financial resource strain: Not on file  . Food insecurity:    Worry: Not on file    Inability: Not on file  . Transportation needs:    Medical: Not on file    Non-medical: Not on file  Tobacco Use  . Smoking status: Former Smoker    Packs/day: 1.00    Years: 15.00    Pack years: 15.00    Types: Cigarettes  . Smokeless tobacco: Former Engineer, water and Sexual Activity  . Alcohol use: Yes    Alcohol/week: 0.6 oz    Types: 1 Standard drinks or equivalent per week    Comment: 2 drinks/month  . Drug use: Yes    Types: Marijuana    Comment: marijuana -- every day use  . Sexual activity: Not Currently    Partners: Female  Lifestyle  . Physical activity:    Days per week: Not on file    Minutes per session: Not on file  . Stress: Not on file  Relationships  . Social connections:    Talks on phone: Not on file    Gets together: Not on file    Attends religious service: Not  on file    Active member of club or organization: Not on file    Attends meetings of clubs or organizations: Not on file    Relationship status: Not on file  Other Topics Concern  . Not on file  Social History Narrative   ** Merged History Encounter **       Review of Systems - See HPI.  All other ROS are negative.  BP 124/88   Pulse (!) 59   Temp 97.6 F (36.4 C) (Oral)   Resp 16   Ht 5\' 7"  (1.702 m)   Wt 202 lb (91.6 kg)   SpO2 98%   BMI 31.64 kg/m   Physical Exam  Constitutional: He appears well-developed and well-nourished.  HENT:  Head: Normocephalic and atraumatic.  Eyes: Conjunctivae are normal.  Cardiovascular: Normal rate, regular rhythm and normal heart sounds.  Pulmonary/Chest: Effort normal and breath sounds normal. No stridor. No respiratory distress. He has no wheezes. He has no rales. He exhibits no tenderness.  Musculoskeletal:       Right shoulder: Normal.       Left shoulder: He exhibits pain and spasm. He exhibits normal range of motion,  no tenderness, no bony tenderness and normal strength.       Right elbow: Normal.      Right wrist: Normal.       Cervical back: He exhibits spasm. He exhibits normal range of motion, no tenderness, no bony tenderness and no pain.       Thoracic back: Normal.  Vitals reviewed.  Assessment/Plan: 1. Chronic left shoulder pain Chronic. + history of torn biceps. Strength is preserved but ROM reproduces pain. Concern for tendinopathy and impingement. Will check x-ray today giving chronicity. Reviewed OTC medications. Rx flexeril. Will set up with specialist based on imaging results.  - DG Shoulder Left; Future - methylPREDNISolone (MEDROL) 4 MG TBPK tablet; Take following package directions  Dispense: 21 tablet; Refill: 0 - cyclobenzaprine (FLEXERIL) 10 MG tablet; Take 1 tablet (10 mg total) by mouth at bedtime.  Dispense: 30 tablet; Refill: 0  2. Cervical radiculitis Acute. Will check x-ray of cervical spine. Start Medrol dose pack. Supportive measures reviewed. Will need further assessment with MRI versus Ortho eval if not resolving.  - DG Cervical Spine Complete; Future - methylPREDNISolone (MEDROL) 4 MG TBPK tablet; Take following package directions  Dispense: 21 tablet; Refill: 0   , PA-C

## 2017-11-20 NOTE — Patient Instructions (Signed)
Please go to the [facility] office for x-rays. I will call with your results. Stop lifting weights at the gym until we get this assessed and calmed down. Ok to continue aerobic exercise (cardio).   Take the steroid pack as directed. Use the flexeril at night. We will alter regimens based on lab results.  [facility] [address] [place], [place]

## 2017-12-12 NOTE — Progress Notes (Signed)
[facility] [address] [place], [place] [address] Phone: [phone] Subjective:    I'm seeing this patient by the request  of:  Waldon Merl, PA-C   CC: Bilateral shoulder pain  TFT:[id number]  Zachary Mason is a 37 y.o. male coming in with complaint of bilateral shoulder pain for years. In the right shoulder he is having pain that radiates down into his hand as well as tingling.10/10 pain.  In the mornings he is unable to to raise shoulder in abduction due to pain. History of tearing bilateral biceps. Pain in left shoulder biceps to anterior deltoid. 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 No history of surgeries. Has not tried any treatments. No pain in left shoulder today. Does note a "knot" in the posterior deltoid.   X-rays of the cervical spine that were taken earlier were independently visualized by me showing some mild degenerative disc disease especially from C5-C7 with mild foraminal narrowing on the right side  Patient's left shoulder x-rays were fairly unremarkable except for a possible os acromial    Past Medical History:  Diagnosis Date  . CTS (carpal tunnel syndrome)   . Gout   . HSV-2 (herpes simplex virus 2) infection   . Inguinal hernia    Past Surgical History:  Procedure Laterality Date  . BONE SPUR     Left Foot  . CARPAL TUNNEL RELEASE Left   . CARPAL TUNNEL RELEASE     Left Hand  . HERNIA REPAIR    . TOE SURGERY Right    Social History   Socioeconomic History  . Marital status: Married    Spouse name: Not on file  . Number of children: 3  . Years of education: Not on file  . Highest education level: Not on file  Occupational History  . Not on file  Social Needs  . Financial resource strain: Not on file  . Food insecurity:    Worry: Not on file    Inability: Not on file  . Transportation needs:    Medical: Not on file    Non-medical: Not on file  Tobacco Use  . Smoking status: Former Smoker   Packs/day: 1.00    Years: 15.00    Pack years: 15.00    Types: Cigarettes  . Smokeless tobacco: Former Engineer, water and Sexual Activity  . Alcohol use: Yes    Alcohol/week: 1.0 standard drinks    Types: 1 Standard drinks or equivalent per week    Comment: 2 drinks/month  . Drug use: Yes    Types: Marijuana    Comment: marijuana -- every day use  . Sexual activity: Not Currently    Partners: Female  Lifestyle  . Physical activity:    Days per week: Not on file    Minutes per session: Not on file  . Stress: Not on file  Relationships  . Social connections:    Talks on phone: Not on file    Gets together: Not on file    Attends religious service: Not on file    Active member of club or organization: Not on file    Attends meetings of clubs or organizations: Not on file    Relationship status: Not on file  Other Topics Concern  . Not on file  Social History Narrative   ** Merged History Encounter **       No Known Allergies Family History  Problem Relation Age of Onset  .  Hypertension Neg Hx   . Diabetes Neg Hx   . Breast cancer Maternal Grandmother   . Healthy Mother   . Suicidality Father        Deceased  . Hypertension Paternal Grandfather   . Hypertension Maternal Grandfather   . Diabetes Paternal Grandfather   . Diabetes Maternal Grandfather   . Heart disease Paternal Grandmother        Enlarged Heart  . Healthy Brother        x1 Half  . Healthy Sister        x2  . Healthy Daughter        x2  . Healthy Son        x1     Past medical history, social, surgical and family history all reviewed in electronic medical record.  No pertanent information unless stated regarding to the chief complaint.   Review of Systems:Review of systems updated and as accurate as of 12/12/17  No headache, visual changes, nausea, vomiting, diarrhea, constipation, dizziness, abdominal pain, skin rash, fevers, chills, night sweats, weight loss, swollen lymph nodes, body aches,  joint swelling, , chest pain, shortness of breath, mood changes.  Positive muscle aches  Objective  There were no vitals taken for this visit. Systems examined below as of 12/12/17   General: No apparent distress alert and oriented x3 mood and affect normal, dressed appropriately.  HEENT: Pupils equal, extraocular movements intact  Respiratory: Patient's speak in full sentences and does not appear short of breath  Cardiovascular: No lower extremity edema, non tender, no erythema  Skin: Warm dry intact with no signs of infection or rash on extremities or on axial skeleton.  Abdomen: Soft nontender  Neuro: Cranial nerves II through XII are intact, neurovascularly intact in all extremities with 2+ DTRs and 2+ pulses.  Lymph: No lymphadenopathy of posterior or anterior cervical chain or axillae bilaterally.  Gait normal with good balance and coordination.  MSK:  Non tender with full range of motion and good stability and symmetric strength and tone of  elbows, wrist, hip, knee and ankles bilaterally.  Neck: Inspection unremarkable. No palpable stepoffs. Mild positive Spurling's maneuver in the C7 distribution. Full neck range of motion Grip strength and sensation normal in bilateral hands Strength good C4 to T1 distribution No sensory change to C4 to T1 Negative Hoffman sign bilaterally Reflexes normal Shoulder: left Inspection reveals no abnormalities, atrophy or asymmetry. Palpation is normal with no tenderness over AC joint or bicipital groove. ROM is full in all planes passively. Rotator cuff strength 4+ out of 5 signs of impingement with positive Neer and Hawkin's tests, but negative empty can sign. Speeds and Yergason's tests normal.  Biceps are noted No labral pathology noted with negative Obrien's, negative clunk and good stability. Normal scapular function observed. No painful arc and no drop arm sign. No apprehension sign Contralateral shoulder does have a bicep tear  noted  MSK US performed of: left This study was ordered, performed, and interpreted by Terrilee Files D.O.  Shoulder:   Supraspinatus: Facial tear noted with partial scar tissue formation, Bursal bulge seen with shoulder abduction on impingement view. Subscapularis:  Appears normal on long and transverse views. Positive bursa. AC joint: Mild arthritis Glenohumeral Joint:  Appears normal without effusion. Glenoid Labrum:  Intact without visualized tears. Biceps Tendon: Chronic rupture noted  Impression: Subacromial bursitis  .[id number]; 15 additional minutes spent for Therapeutic exercises as stated in above notes.  This included exercises focusing on stretching, strengthening, with  significant focus on eccentric aspects.   Long term goals include an improvement in range of motion, strength, endurance as well as avoiding reinjury. Patient's frequency would include in 1-2 times a day, 3-5 times a week for a duration of 6-12 weeks. Shoulder Exercises that included:  Basic scapular stabilization to include adduction and depression of scapula Scaption, focusing on proper movement and good control Internal and External rotation utilizing a theraband, with elbow tucked at side entire time Rows with theraband which was given   Proper technique shown and discussed handout in great detail with ATC.  All questions were discussed and answered.      Impression and Recommendations:     This case required medical decision making of moderate complexity.      Note: This dictation was prepared with Dragon dictation along with smaller phrase technology. Any transcriptional errors that result from this process are unintentional.

## 2017-12-13 ENCOUNTER — Ambulatory Visit: Payer: BLUE CROSS/BLUE SHIELD | Admitting: Family Medicine

## 2017-12-13 ENCOUNTER — Ambulatory Visit: Payer: Self-pay

## 2017-12-13 ENCOUNTER — Encounter: Payer: Self-pay | Admitting: Family Medicine

## 2017-12-13 VITALS — BP 124/90 | HR 78 | Ht 67.0 in | Wt 199.0 lb

## 2017-12-13 DIAGNOSIS — M75112 Incomplete rotator cuff tear or rupture of left shoulder, not specified as traumatic: Secondary | ICD-10-CM

## 2017-12-13 DIAGNOSIS — M5412 Radiculopathy, cervical region: Secondary | ICD-10-CM

## 2017-12-13 DIAGNOSIS — M25512 Pain in left shoulder: Secondary | ICD-10-CM

## 2017-12-13 DIAGNOSIS — M75102 Unspecified rotator cuff tear or rupture of left shoulder, not specified as traumatic: Secondary | ICD-10-CM | POA: Insufficient documentation

## 2017-12-13 MED ORDER — VITAMIN D (ERGOCALCIFEROL) 1.25 MG (50000 UNIT) PO CAPS: 50000.0000 [IU] | ORAL_CAPSULE | ORAL | 0 refills | Status: DC

## 2017-12-13 MED ORDER — DICLOFENAC SODIUM 2 % TD SOLN: 2.0000 g | Freq: Two times a day (BID) | TRANSDERMAL | 3 refills | Status: DC

## 2017-12-13 MED ORDER — GABAPENTIN 100 MG PO CAPS: 200.0000 mg | ORAL_CAPSULE | Freq: Every day | ORAL | 3 refills | Status: DC

## 2017-12-13 NOTE — Assessment & Plan Note (Signed)
Partial tear of the rotator cuff.  I do believe that patient would likely heal with having no retraction.  Discussed icing regimen and home exercises, discussed which activities to do which wants to avoid.  Patient is to increase activity slowly over the course the next several weeks.  Work with Event organiser to learn the exercises in greater detail.  Gabapentin given for some of the cervical radiculopathy.  Follow-up again in 4 weeks

## 2017-12-13 NOTE — Assessment & Plan Note (Signed)
Cervical radiculopathy noted.  Discussed with patient about gabapentin.  Home exercises for scapular dyskinesis and helping with posture and ergonomics.  Follow-up again in 4 weeks

## 2017-12-13 NOTE — Patient Instructions (Signed)
Good to see you  Ice 20 minutes 2 times daily. Usually after activity and before bed. pennsaid pinkie amount topically 2 times daily as needed.  Keep hands within peripheral vision  Once weekly vitamin D for the strength and endurance.  Gabapentin 200mg  at night See me again in 4 weeks

## 2018-01-11 ENCOUNTER — Ambulatory Visit: Payer: BLUE CROSS/BLUE SHIELD | Admitting: Family Medicine

## 2018-01-12 ENCOUNTER — Emergency Department (HOSPITAL_BASED_OUTPATIENT_CLINIC_OR_DEPARTMENT_OTHER)
Admission: EM | Admit: 2018-01-12 | Discharge: 2018-01-12 | Disposition: A | Discharge status: Home / Self Care | Payer: BLUE CROSS/BLUE SHIELD | Attending: Emergency Medicine | Admitting: Emergency Medicine

## 2018-01-12 ENCOUNTER — Other Ambulatory Visit: Payer: Self-pay

## 2018-01-12 ENCOUNTER — Emergency Department (HOSPITAL_BASED_OUTPATIENT_CLINIC_OR_DEPARTMENT_OTHER): Payer: BLUE CROSS/BLUE SHIELD

## 2018-01-12 ENCOUNTER — Encounter (HOSPITAL_BASED_OUTPATIENT_CLINIC_OR_DEPARTMENT_OTHER): Payer: Self-pay | Admitting: *Deleted

## 2018-01-12 DIAGNOSIS — Y929 Unspecified place or not applicable: Secondary | ICD-10-CM | POA: Diagnosis not present

## 2018-01-12 DIAGNOSIS — Y999 Unspecified external cause status: Secondary | ICD-10-CM | POA: Diagnosis not present

## 2018-01-12 DIAGNOSIS — Y9389 Activity, other specified: Secondary | ICD-10-CM | POA: Diagnosis not present

## 2018-01-12 DIAGNOSIS — S61217A Laceration without foreign body of left little finger without damage to nail, initial encounter: Secondary | ICD-10-CM

## 2018-01-12 DIAGNOSIS — W268XXA Contact with other sharp object(s), not elsewhere classified, initial encounter: Secondary | ICD-10-CM | POA: Diagnosis not present

## 2018-01-12 DIAGNOSIS — Z79899 Other long term (current) drug therapy: Secondary | ICD-10-CM | POA: Insufficient documentation

## 2018-01-12 DIAGNOSIS — Z87891 Personal history of nicotine dependence: Secondary | ICD-10-CM | POA: Diagnosis not present

## 2018-01-12 MED ORDER — LIDOCAINE HCL 1 % IJ SOLN
INTRAMUSCULAR | Status: AC
  Administered 2018-01-12: 20 mL via INTRADERMAL
  Filled 2018-01-12: qty 20

## 2018-01-12 MED ORDER — LIDOCAINE HCL 1 % IJ SOLN: 20.0000 mL | Freq: Once | INTRAMUSCULAR | Status: DC

## 2018-01-12 MED ORDER — LIDOCAINE HCL 1 % IJ SOLN
20.0000 mL | Freq: Once | INTRAMUSCULAR | Status: AC
  Administered 2018-01-12: 20 mL via INTRADERMAL

## 2018-01-12 NOTE — ED Notes (Signed)
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  Laceration to left outer lateral area to 5th finger.  Bleeding controlled.

## 2018-01-12 NOTE — Discharge Instructions (Signed)
Please read and follow all provided instructions.  Your diagnoses today is a laceration. A laceration is a cut or lesion that goes through all layers of the skin and into the tissue just beneath the skin. This was repaired with 5 stitches or a tissue adhesive similar to a super glue.  Follow up with your doctor, an urgent care, or this Emergency Department for removal of your stitches in 7-10 days. Keep the wound clean and dry for the next 24 hours and leave the dressing in place. You may shower after 24 hours. Do not soak the area for long periods of times as in a bath until the sutures are removed. After 24 hours you may remove the dressing and gently clean the laceration site with antibacterial soap (i.e. Neosporin or Bacitracin) and warm water 2 times a day. Pat dry with clean towel. Do not scrub. Once the wound has healed, scarring can be minimized by covering the wound with sunscreen during the day for 1 full year.  Return instructions:  You have redness, swelling, or increasing pain in the wound.  You see a red line that goes away from the wound.  You have yellowish-white fluid (pus) coming from the wound.  You have a fever (above 100.33F) You notice a bad smell coming from the wound or dressing.  Your wound breaks open before or after sutures have been removed.  You notice something coming out of the wound such as wood or glass.  Your wound is on your hand or foot and you cannot move a finger or toe.  Your pain is not controlled with prescribed medicine.     Additional Information:  If you did not receive a tetanus shot today because you thought you were up to date, but did not recall when your last one was given, make sure to check with your primary caregiver to determine if you need one.   Your vital signs today were: BP (!) 135/95 (BP Location: Right Arm)    Pulse 76    Temp 98.1 F (36.7 C) (Oral)    Resp 18    Ht 5\' 7"  (1.702 m)    Wt 95.3 kg    SpO2 100%    BMI 32.89 kg/m  If  your blood pressure (BP) was elevated above 135/85 this visit, please have this repeated by your doctor within one month.

## 2018-01-12 NOTE — ED Notes (Signed)
Wound care performed.  Cleansed with wound cleanser, applied bacitracin, applied 2x2's, wrapped with kling and secured with tape.  Finger splint applied loosely to prevent rupture of sutures.  Pt tolerated well.

## 2018-01-12 NOTE — ED Provider Notes (Signed)
[facility] EMERGENCY DEPARTMENT Provider Note   CSN: [id number] Arrival date & time: 01/12/18  1004     History   Chief Complaint Chief Complaint  Patient presents with  . Laceration    HPI Zachary Mason is a 37 y.o. male with past medical history as below who presents the emergent department today for laceration to the left fifth digit that occurred approximately 8:30 AM this morning.  Patient reports that he was changing a wheel on his truck and afterwards had to cut a piece of plastic from the undercarriage.  He reports he was using a razor blade when it slipped and cut his left fifth digit on the dorsal aspect from the DIP to the PIP.  Bleeding is currently controlled.  He denies any decreased range of motion.  No numbness or tingling.  His tetanus is up-to-date.  He has not take anything for symptoms.  HPI  Past Medical History:  Diagnosis Date  . CTS (carpal tunnel syndrome)   . Gout   . HSV-2 (herpes simplex virus 2) infection   . Inguinal hernia     Patient Active Problem List   Diagnosis Date Noted  . Left rotator cuff tear 12/13/2017  . Right cervical radiculopathy 12/13/2017  . Medial epicondylitis of elbow, right 12/24/2016  . Insomnia 12/24/2016  . Tobacco use disorder 12/24/2016  . HSV-2 (herpes simplex virus 2) infection 05/20/2016  . Inguinal hernia - right     Past Surgical History:  Procedure Laterality Date  . BONE SPUR     Left Foot  . CARPAL TUNNEL RELEASE Left   . CARPAL TUNNEL RELEASE     Left Hand  . HERNIA REPAIR    . TOE SURGERY Right         Home Medications    Prior to Admission medications   Medication Sig Start Date End Date Taking? Authorizing Provider  cyclobenzaprine (FLEXERIL) 10 MG tablet Take 1 tablet (10 mg total) by mouth at bedtime. 11/20/17   Waldon Merl, PA-C  Diclofenac Sodium 2 % SOLN Place 2 g onto the skin 2 (two) times daily. 12/13/17   Judi Saa, DO  gabapentin (NEURONTIN) 100 MG capsule  Take 2 capsules (200 mg total) by mouth at bedtime. 12/13/17   Judi Saa, DO  valACYclovir (VALTREX) 1000 MG tablet TAKE 1 TABLET BY MOUTH DAILY 10/02/17   Waldon Merl, PA-C  Vitamin D, Ergocalciferol, (DRISDOL) 50000 units CAPS capsule Take 1 capsule (50,000 Units total) by mouth every 7 (seven) days. 12/13/17   Judi Saa, DO    Family History Family History  Problem Relation Age of Onset  . Heart disease Paternal Grandmother        Enlarged Heart  . Healthy Mother   . Suicidality Father        Deceased  . Breast cancer Maternal Grandmother   . Hypertension Paternal Grandfather   . Diabetes Paternal Grandfather   . Hypertension Maternal Grandfather   . Diabetes Maternal Grandfather   . Healthy Brother        x1 Half  . Healthy Sister        x2  . Healthy Daughter        x2  . Healthy Son        x1    Social History Social History   Tobacco Use  . Smoking status: Former Smoker    Packs/day: 1.00    Years: 15.00    Pack  years: 15.00    Types: Cigarettes  . Smokeless tobacco: Former Engineer, water Use Topics  . Alcohol use: Yes    Alcohol/week: 1.0 standard drinks    Types: 1 Standard drinks or equivalent per week    Comment: 2 drinks/month  . Drug use: Yes    Types: Marijuana    Comment: marijuana -- every day use     Allergies   Patient has no known allergies.   Review of Systems Review of Systems  Constitutional: Negative for fever.  Musculoskeletal: Negative for arthralgias.  Skin: Positive for wound.  Neurological: Negative for weakness and numbness.     Physical Exam Updated Vital Signs BP (!) 128/91 (BP Location: Right Arm)   Pulse 76   Temp 98.1 F (36.7 C) (Oral)   Resp 18   Ht 5\' 7"  (1.702 m)   Wt 95.3 kg   SpO2 100%   BMI 32.89 kg/m   Physical Exam  Constitutional: He appears well-developed and well-nourished.  HENT:  Head: Normocephalic and atraumatic.  Right Ear: External ear normal.  Left Ear: External ear  normal.  Eyes: Conjunctivae are normal. Right eye exhibits no discharge. Left eye exhibits no discharge. No scleral icterus.  Cardiovascular:  Pulses:      Radial pulses are 2+ on the right side, and 2+ on the left side.  Pulmonary/Chest: Effort normal. No respiratory distress.  Musculoskeletal:  Left hand: Patient with a 3cm curvilinear laceartion that extends from the PIP to DIP. No foreign body is identified.  Mildly lobulated range of motion of the fifth digit secondary to pain and laceration.  He does have intact resisted flexion extension of isolated MCP, PIP, DIP of the fifth digit.  FDS/FDP intact.  Sensation intact to light touch distally.  Neurological: He is alert. He has normal strength. No sensory deficit.  Skin: Skin is warm and dry. Capillary refill takes less than 2 seconds. Laceration noted. No pallor.  Psychiatric: He has a normal mood and affect.  Nursing note and vitals reviewed.    ED Treatments / Results  Labs (all labs ordered are listed, but only abnormal results are displayed) Labs Reviewed - No data to display  EKG None  Radiology Dg Hand Complete Left  Result Date: 01/12/2018 CLINICAL DATA:  Left little finger laceration while working on a truck today. EXAM: LEFT HAND - COMPLETE 3+ VIEW COMPARISON:  None. FINDINGS: Left little finger soft tissue irregularity. No fracture, dislocation or radiopaque foreign body. IMPRESSION: Soft tissue injury without fracture or radiopaque foreign body. Electronically Signed   By: Beckie Salts M.D.   On: 01/12/2018 12:40    Procedures .Marland KitchenLaceration Repair Date/Time: 01/12/2018 1:33 PM Performed by: Jacinto Halim, PA-C Authorized by: Jacinto Halim, PA-C   Consent:    Consent obtained:  Verbal   Consent given by:  Patient   Risks discussed:  Infection, need for additional repair, nerve damage, poor wound healing, poor cosmetic result, pain, retained foreign body, tendon damage and vascular damage   Alternatives  discussed:  No treatment Anesthesia (see MAR for exact dosages):    Anesthesia method:  Nerve block   Block location:  Digital   Block needle gauge:  25 G   Block anesthetic:  Lidocaine 1% w/o epi   Block technique:  Digital   Block injection procedure:  Anatomic landmarks identified, anatomic landmarks palpated, negative aspiration for blood, introduced needle and incremental injection   Block outcome:  Anesthesia achieved Laceration details:  Location:  Finger   Finger location:  L small finger   Length (cm):  3 Repair type:    Repair type:  Simple Pre-procedure details:    Preparation:  Patient was prepped and draped in usual sterile fashion and imaging obtained to evaluate for foreign bodies Exploration:    Hemostasis achieved with:  Direct pressure   Wound exploration: wound explored through full range of motion and entire depth of wound probed and visualized     Wound extent: no foreign bodies/material noted and no tendon damage noted     Contaminated: no   Treatment:    Area cleansed with:  Saline and Betadine   Amount of cleaning:  Standard   Irrigation solution:  Sterile water   Irrigation volume:  500   Irrigation method:  Pressure wash   Visualized foreign bodies/material removed: no   Skin repair:    Repair method:  Sutures   Suture size:  5-0   Suture material:  Prolene   Suture technique:  Simple interrupted   Number of sutures:  5 Approximation:    Approximation:  Close Post-procedure details:    Dressing:  Non-adherent dressing and splint for protection   Patient tolerance of procedure:  Tolerated well, no immediate complications   (including critical care time)   Medications Ordered in ED Medications  lidocaine (XYLOCAINE) 1 % (with pres) injection 20 mL (has no administration in time range)     Initial Impression / Assessment and Plan / ED Course  I have reviewed the triage vital signs and the nursing notes.  Pertinent labs & imaging results  that were available during my care of the patient were reviewed by me and considered in my medical decision making (see chart for details).     37 y.o. male with left 5th digit laceration. He is NVI. No evidence of tendon injury.  X-ray without fracture or foreign body. Pressure irrigation performed. Wound explored and base of wound visualized in a bloodless field without evidence of foreign body.  Laceration occurred < 18 hours prior to repair which was well tolerated. Tetanus is up to date.  Pt has no comorbidities to effect normal wound healing. Pt discharged without antibiotics.  Discussed suture home care with patient and answered questions. Pt to follow-up for wound check and suture removal in 7 days; they are to return to the ED sooner for signs of infection. Pt is hemodynamically stable with no complaints prior to dc.   Final Clinical Impressions(s) / ED Diagnoses   Final diagnoses:  Laceration of left little finger without foreign body without damage to nail, initial encounter    ED Discharge Orders    None       Princella Pellegrini 01/12/18 1337    Pricilla Loveless, MD 01/15/18 [phone]

## 2018-03-14 ENCOUNTER — Other Ambulatory Visit: Payer: Self-pay | Admitting: Physician Assistant

## 2018-03-14 DIAGNOSIS — B009 Herpesviral infection, unspecified: Secondary | ICD-10-CM

## 2018-05-30 ENCOUNTER — Ambulatory Visit: Payer: Self-pay

## 2018-05-30 ENCOUNTER — Ambulatory Visit: Payer: BLUE CROSS/BLUE SHIELD | Admitting: Family Medicine

## 2018-05-30 ENCOUNTER — Encounter: Payer: Self-pay | Admitting: Family Medicine

## 2018-05-30 VITALS — BP 142/90 | HR 102 | Ht 67.0 in | Wt 209.0 lb

## 2018-05-30 DIAGNOSIS — M5412 Radiculopathy, cervical region: Secondary | ICD-10-CM | POA: Diagnosis not present

## 2018-05-30 DIAGNOSIS — M7552 Bursitis of left shoulder: Secondary | ICD-10-CM | POA: Diagnosis not present

## 2018-05-30 DIAGNOSIS — M7551 Bursitis of right shoulder: Secondary | ICD-10-CM | POA: Diagnosis not present

## 2018-05-30 DIAGNOSIS — M109 Gout, unspecified: Secondary | ICD-10-CM | POA: Diagnosis not present

## 2018-05-30 DIAGNOSIS — M25512 Pain in left shoulder: Principal | ICD-10-CM

## 2018-05-30 DIAGNOSIS — G8929 Other chronic pain: Secondary | ICD-10-CM

## 2018-05-30 MED ORDER — GABAPENTIN 100 MG PO CAPS: 200.0000 mg | ORAL_CAPSULE | Freq: Every day | ORAL | 3 refills | Status: DC

## 2018-05-30 MED ORDER — HYDROXYZINE HCL 25 MG PO TABS: 25.0000 mg | ORAL_TABLET | Freq: Three times a day (TID) | ORAL | 1 refills | Status: DC | PRN

## 2018-05-30 NOTE — Progress Notes (Signed)
Zachary Mason  D.O. [facility] [address] [place], [place] [address] Phone: [phone] Subjective:      CC: Shoulder pain follow-up  BJY:[id number]:Subjective  Zachary CanalesRussell B Kvamme is a 38 y.o. male coming in with complaint of neck shoulder pain.  Was found to have a partial tear of the rotator cuff.  There was no retraction at that time.  Patient was to do home exercises, icing regimen, topical anti-inflammatories and given gabapentin for the possibility of cervical radiculopathy.  It has been nearly 6 months since we have seen patient.  Patient states that the right is worse than the left. Numbness in the fingers on the right side. Pain keeps him up at night. Only sleeping about 3 hours. Movement makes his pain better. Weakness in the hand. Upper trapezius tightness and pain. Pain radiates to the chest (Pectoral) to the point where he can't move.      Past Medical History:  Diagnosis Date  . CTS (carpal tunnel syndrome)   . Gout   . HSV-2 (herpes simplex virus 2) infection   . Inguinal hernia    Past Surgical History:  Procedure Laterality Date  . BONE SPUR     Left Foot  . CARPAL TUNNEL RELEASE Left   . CARPAL TUNNEL RELEASE     Left Hand  . HERNIA REPAIR    . TOE SURGERY Right    Social History   Socioeconomic History  . Marital status: Married    Spouse name: Not on file  . Number of children: 3  . Years of education: Not on file  . Highest education level: Not on file  Occupational History  . Not on file  Social Needs  . Financial resource strain: Not on file  . Food insecurity:    Worry: Not on file    Inability: Not on file  . Transportation needs:    Medical: Not on file    Non-medical: Not on file  Tobacco Use  . Smoking status: Former Smoker    Packs/day: 1.00    Years: 15.00    Pack years: 15.00    Types: Cigarettes  . Smokeless tobacco: Former Engineer, waterUser  Substance and Sexual Activity  . Alcohol use: Yes    Alcohol/week: 1.0 standard drinks    Types: 1  Standard drinks or equivalent per week    Comment: 2 drinks/month  . Drug use: Yes    Types: Marijuana    Comment: marijuana -- every day use  . Sexual activity: Not Currently    Partners: Female  Lifestyle  . Physical activity:    Days per week: Not on file    Minutes per session: Not on file  . Stress: Not on file  Relationships  . Social connections:    Talks on phone: Not on file    Gets together: Not on file    Attends religious service: Not on file    Active member of club or organization: Not on file    Attends meetings of clubs or organizations: Not on file    Relationship status: Not on file  Other Topics Concern  . Not on file  Social History Narrative   ** Merged History Encounter **       No Known Allergies Family History  Problem Relation Age of Onset  . Heart disease Paternal Grandmother        Enlarged Heart  . Healthy Mother   . Suicidality Father  Deceased  . Breast cancer Maternal Grandmother   . Hypertension Paternal Grandfather   . Diabetes Paternal Grandfather   . Hypertension Maternal Grandfather   . Diabetes Maternal Grandfather   . Healthy Brother        x1 Half  . Healthy Sister        x2  . Healthy Daughter        x2  . Healthy Son        x1         Current Outpatient Medications (Other):  .  cyclobenzaprine (FLEXERIL) 10 MG tablet, Take 1 tablet (10 mg total) by mouth at bedtime. .  Diclofenac Sodium 2 % SOLN, Place 2 g onto the skin 2 (two) times daily. Marland Kitchen.  gabapentin (NEURONTIN) 100 MG capsule, Take 2 capsules (200 mg total) by mouth at bedtime. .  valACYclovir (VALTREX) 1000 MG tablet, TAKE 1 TABLET BY MOUTH DAILY .  Vitamin D, Ergocalciferol, (DRISDOL) 50000 units CAPS capsule, Take 1 capsule (50,000 Units total) by mouth every 7 (seven) days. .  hydrOXYzine (ATARAX/VISTARIL) 25 MG tablet, Take 1 tablet (25 mg total) by mouth 3 (three) times daily as needed.    Past medical history, social, surgical and family history  all reviewed in electronic medical record.  No pertanent information unless stated regarding to the chief complaint.   Review of Systems:  No headache, visual changes, nausea, vomiting, diarrhea, constipation, dizziness, abdominal pain, skin rash, fevers, chills, night sweats, weight loss, swollen lymph nodes, body aches, joint swelling, , chest pain, shortness of breath, mood changes.  Positive muscle aches  Objective  Blood pressure (!) 142/90, pulse (!) 102, height 5\' 7"  (1.702 m), weight 209 lb (94.8 kg), SpO2 96 %.    General: No apparent distress alert and oriented x3 mood and affect normal, dressed appropriately.  HEENT: Pupils equal, extraocular movements intact  Respiratory: Patient's speak in full sentences and does not appear short of breath  Cardiovascular: No lower extremity edema, non tender, no erythema  Skin: Warm dry intact with no signs of infection or rash on extremities or on axial skeleton.  Abdomen: Soft nontender  Neuro: Cranial nerves II through XII are intact, neurovascularly intact in all extremities with 2+ DTRs and 2+ pulses.  Lymph: No lymphadenopathy of posterior or anterior cervical chain or axillae bilaterally.  Gait normal with good balance and coordination.  MSK:  Non tender with full range of motion and good stability and symmetric strength and tone of elbows, wrist, hip, knee and ankles bilaterally.  Neck: Inspection .  Also lordosis No palpable stepoffs. Positive Spurling's maneuver right side Patient does have limited sidebending to the right and rotation to the left  Grip strength and sensation normal in bilateral hands Strength good C4 to T1 distribution No sensory change to C4 to T1 Negative Hoffman sign bilaterally Reflexes normal Shoulder: Bilateral Inspection reveals no abnormalities, atrophy or asymmetry. Palpation is normal with no tenderness over AC joint or bicipital groove. ROM is full in all planes. Rotator cuff strength normal  throughout. impingement bilaterally Speeds and Yergason's tests normal. Positive O'Brien's Normal scapular function observed. No apprehension sign   Procedure: Real-time Ultrasound Guided Injection of right glenohumeral joint Device: GE Logiq Q7  Ultrasound guided injection is preferred based studies that show increased duration, increased effect, greater accuracy, decreased procedural pain, increased response rate with ultrasound guided versus blind injection.  Verbal informed consent obtained.  Time-out conducted.  Noted no overlying erythema, induration, or other signs of  local infection.  Skin prepped in a sterile fashion.  Local anesthesia: Topical Ethyl chloride.  With sterile technique and under real time ultrasound guidance:  Joint visualized.  23g 1  inch needle inserted posterior approach. Pictures taken for needle placement. Patient did have injection of 2 cc of 1% lidocaine, 2 cc of 0.5% Marcaine, and 1.0 cc of Kenalog 40 mg/dL. Completed without difficulty  Pain immediately resolved suggesting accurate placement of the medication.  Advised to call if fevers/chills, erythema, induration, drainage, or persistent bleeding.  Images permanently stored and available for review in the ultrasound unit.  Impression: Technically successful ultrasound guided injection.  Procedure: Real-time Ultrasound Guided Injection of left glenohumeral joint Device: GE Logiq E  Ultrasound guided injection is preferred based studies that show increased duration, increased effect, greater accuracy, decreased procedural pain, increased response rate with ultrasound guided versus blind injection.  Verbal informed consent obtained.  Time-out conducted.  Noted no overlying erythema, induration, or other signs of local infection.  Skin prepped in a sterile fashion.  Local anesthesia: Topical Ethyl chloride.  With sterile technique and under real time ultrasound guidance:  Joint visualized.  21g 2 inch  needle inserted posterior approach. Pictures taken for needle placement. Patient did have injection of 2 cc of 0.5% Marcaine, and 1cc of Kenalog 40 mg/dL. Completed without difficulty  Pain immediately resolved suggesting accurate placement of the medication.  Advised to call if fevers/chills, erythema, induration, drainage, or persistent bleeding.  Images permanently stored and available for review in the ultrasound unit.  Impression: Technically successful ultrasound guided injection.   Impression and Recommendations:     This case required medical decision making of moderate complexity. The above documentation has been reviewed and is accurate and complete Judi Saa, DO       Note: This dictation was prepared with Dragon dictation along with smaller phrase technology. Any transcriptional errors that result from this process are unintentional.

## 2018-05-30 NOTE — Assessment & Plan Note (Signed)
History of gout.  Discussed icing regimen and home exercise.  Discussed which activities to do which wants to avoid.  Discussed with patient about hydration and food choices.  May need allopurinol in the long run

## 2018-05-30 NOTE — Assessment & Plan Note (Signed)
Still concern for more of a right-sided cervical radiculopathy than truly any type of carpal tunnel.  Discussed the possibility of MRI.  Patient wants to hold at this point and see how the injections do.  Follow-up again in 4 to 6 weeks

## 2018-05-30 NOTE — Assessment & Plan Note (Signed)
Bilateral injection given today.  Encourage patient to do the home exercises with patient declining any formal physical therapy.  We discussed all that some of this pain could be secondary to cervical radiculopathy or patient's underlying gout.  Patient given a trial of gout medications as well.  Worsening symptoms will need MRI of the neck with patient having moderate degenerative changes at a young age.  Follow-up again 4 to 6 weeks.

## 2018-05-30 NOTE — Patient Instructions (Addendum)
Good to see you  Ice is your friend Injected both shoulders today  I think some is coming from the neck  Hydroxyzine up to 3 times a day to help with anxiwty or muscle tightness, try it at home first  Find tart cherry extract any dose at night Try colchicine 2 times a day for 3 days  See me again in 4 weeks and if still in pain then we will need MRI  Of the neck

## 2018-06-28 ENCOUNTER — Ambulatory Visit (INDEPENDENT_AMBULATORY_CARE_PROVIDER_SITE_OTHER): Payer: BLUE CROSS/BLUE SHIELD | Admitting: Family Medicine

## 2018-06-28 ENCOUNTER — Encounter: Payer: Self-pay | Admitting: Family Medicine

## 2018-06-28 DIAGNOSIS — G8929 Other chronic pain: Secondary | ICD-10-CM | POA: Diagnosis not present

## 2018-06-28 DIAGNOSIS — M7552 Bursitis of left shoulder: Secondary | ICD-10-CM | POA: Diagnosis not present

## 2018-06-28 DIAGNOSIS — M999 Biomechanical lesion, unspecified: Secondary | ICD-10-CM

## 2018-06-28 DIAGNOSIS — M7551 Bursitis of right shoulder: Secondary | ICD-10-CM

## 2018-06-28 DIAGNOSIS — M542 Cervicalgia: Secondary | ICD-10-CM

## 2018-06-28 NOTE — Patient Instructions (Signed)
Good to see you  We started manipulation  pennsaid pinkie amount topically 2 times daily as needed.  If you like it call us Continue the exercises 3 times a week  Use the gabapentin at night when needed See me again in 4 weeks and we will continue manipulation if you think it helps

## 2018-06-28 NOTE — Assessment & Plan Note (Signed)
Decision today to treat with OMT was based on Physical Exam  After verbal consent patient was treated with HVLA, ME, FPR techniques in cervical, thoracic, rib areas  Patient tolerated the procedure well with improvement in symptoms  Patient given exercises, stretches and lifestyle modifications  See medications in patient instructions if given  Patient will follow up in 4-6 weeks 

## 2018-06-28 NOTE — Assessment & Plan Note (Signed)
I believe likely more posterior as well as muscle imbalances.  Attempted osteopathic manipulation with some mild improvement.  Discussed posture and ergonomics.  Discussed importance of scapular exercises.  Follow-up again in 4 weeks

## 2018-06-28 NOTE — Assessment & Plan Note (Signed)
Improved after the injections at last exam.  Seems to be doing relatively well.  Still have some very mild weakness of the left rotator cuff compared to the right side.  Encourage patient to try to do the exercises on a more regular basis.  Discussed topical anti-inflammatories, but I do believe that patient still has a very good chance of healing.  Patient will follow-up with me again in 4 to 6 weeks.  Attempted osteopathic manipulation as well to help with some of the chronic neck and bilateral shoulder pain

## 2018-06-28 NOTE — Progress Notes (Signed)
[facility] [address] [place], [place] [address] Phone: [phone] Subjective:      Zachary Mason, am serving as a scribe for Dr. Antoine Primas.  CC: Neck and bilateral shoulder pain follow-up  BBC:[id number]   05/30/2018: Bilateral injection given today.  Encourage patient to do the home exercises with patient declining any formal physical therapy.  We discussed all that some of this pain could be secondary to cervical radiculopathy or patient's underlying gout.  Patient given a trial of gout medications as well.  Worsening symptoms will need MRI of the neck with patient having moderate degenerative changes at a young age.  Follow-up again 4 to 6 weeks.  Update 2/272/2020: Zachary Mason is a 38 y.o. male coming in with complaint of bilateral shoulder pain. Patient states that he has felt improvement. Has not had any tingling but is having achy pain intermittently.  70% better discussed which activities are better discuss HEP but only do them intermittently.     Past Medical History:  Diagnosis Date  . CTS (carpal tunnel syndrome)   . Gout   . HSV-2 (herpes simplex virus 2) infection   . Inguinal hernia    Past Surgical History:  Procedure Laterality Date  . BONE SPUR     Left Foot  . CARPAL TUNNEL RELEASE Left   . CARPAL TUNNEL RELEASE     Left Hand  . HERNIA REPAIR    . TOE SURGERY Right    Social History   Socioeconomic History  . Marital status: Married    Spouse name: Not on file  . Number of children: 3  . Years of education: Not on file  . Highest education level: Not on file  Occupational History  . Not on file  Social Needs  . Financial resource strain: Not on file  . Food insecurity:    Worry: Not on file    Inability: Not on file  . Transportation needs:    Medical: Not on file    Non-medical: Not on file  Tobacco Use  . Smoking status: Former Smoker    Packs/day: 1.00    Years: 15.00    Pack years: 15.00   Types: Cigarettes  . Smokeless tobacco: Former Engineer, water and Sexual Activity  . Alcohol use: Yes    Alcohol/week: 1.0 standard drinks    Types: 1 Standard drinks or equivalent per week    Comment: 2 drinks/month  . Drug use: Yes    Types: Marijuana    Comment: marijuana -- every day use  . Sexual activity: Not Currently    Partners: Female  Lifestyle  . Physical activity:    Days per week: Not on file    Minutes per session: Not on file  . Stress: Not on file  Relationships  . Social connections:    Talks on phone: Not on file    Gets together: Not on file    Attends religious service: Not on file    Active member of club or organization: Not on file    Attends meetings of clubs or organizations: Not on file    Relationship status: Not on file  Other Topics Concern  . Not on file  Social History Narrative   ** Merged History Encounter **       No Known Allergies Family History  Problem Relation Age of Onset  . Heart disease Paternal Grandmother        Enlarged Heart  .  Healthy Mother   . Suicidality Father        Deceased  . Breast cancer Maternal Grandmother   . Hypertension Paternal Grandfather   . Diabetes Paternal Grandfather   . Hypertension Maternal Grandfather   . Diabetes Maternal Grandfather   . Healthy Brother        x1 Half  . Healthy Sister        x2  . Healthy Daughter        x2  . Healthy Son        x1         Current Outpatient Medications (Other):  .  cyclobenzaprine (FLEXERIL) 10 MG tablet, Take 1 tablet (10 mg total) by mouth at bedtime. .  Diclofenac Sodium 2 % SOLN, Place 2 g onto the skin 2 (two) times daily. Marland Kitchen  gabapentin (NEURONTIN) 100 MG capsule, Take 2 capsules (200 mg total) by mouth at bedtime. .  hydrOXYzine (ATARAX/VISTARIL) 25 MG tablet, Take 1 tablet (25 mg total) by mouth 3 (three) times daily as needed. .  valACYclovir (VALTREX) 1000 MG tablet, TAKE 1 TABLET BY MOUTH DAILY .  Vitamin D, Ergocalciferol, (DRISDOL)  50000 units CAPS capsule, Take 1 capsule (50,000 Units total) by mouth every 7 (seven) days.    Past medical history, social, surgical and family history all reviewed in electronic medical record.  No pertanent information unless stated regarding to the chief complaint.   Review of Systems:  No headache, visual changes, nausea, vomiting, diarrhea, constipation, dizziness, abdominal pain, skin rash, fevers, chills, night sweats, weight loss, swollen lymph nodes, body aches, joint swelling,chest pain, shortness of breath, mood changes.  Positive muscle aches  Objective  Blood pressure 108/82, pulse 80, height 5\' 7"  (1.702 m), weight 204 lb (92.5 kg), SpO2 97 %.   General: No apparent distress alert and oriented x3 mood and affect normal, dressed appropriately.  HEENT: Pupils equal, extraocular movements intact  Respiratory: Patient's speak in full sentences and does not appear short of breath  Cardiovascular: No lower extremity edema, non tender, no erythema  Skin: Warm dry intact with no signs of infection or rash on extremities or on axial skeleton.  Abdomen: Soft nontender  Neuro: Cranial nerves II through XII are intact, neurovascularly intact in all extremities with 2+ DTRs and 2+ pulses.  Lymph: No lymphadenopathy of posterior or anterior cervical chain or axillae bilaterally.  Gait normal with good balance and coordination.  MSK:  Non tender with full range of motion and good stability and symmetric strength and tone of  elbows, wrist, hip, knee and ankles bilaterally.  Neck: Inspection loss of lordosis. No palpable stepoffs. Negative Spurling's maneuver. Full neck range of motion Grip strength and sensation normal in bilateral hands Strength good C4 to T1 distribution No sensory change to C4 to T1 Negative Hoffman sign bilaterally Reflexes normal Tightness in the right side of the trapezius.  Left shoulder exam shows 4+ out of 5 strength compared to the contralateral side.   Patient's right shoulder unremarkable.  Full range of motion of both shoulders.  No pain with internal or external range of motion.  Neurovascular intact distally  Osteopathic findings C2 flexed rotated and side bent right C6 flexed rotated and side bent left T3 extended rotated and side bent right inhaled third rib T9 extended rotated and side bent left    Impression and Recommendations:     This case required medical decision making of moderate complexity. The above documentation has been reviewed and is  accurate and complete Lyndal Pulley, DO       Note: This dictation was prepared with Dragon dictation along with smaller phrase technology. Any transcriptional errors that result from this process are unintentional.

## 2018-07-29 NOTE — Progress Notes (Deleted)
[facility] [address] [place], [place] [address] Phone: [phone] Subjective:    I'm seeing this patient by the request  of:    CC: Neck pain, back pain, bilateral shoulder pain follow-up  [id number]  Zachary Mason is a 38 y.o. male coming in with complaint of ***  Onset-  Location Duration-  Character- Aggravating factors- Reliving factors-  Therapies tried-  Severity-     Past Medical History:  Diagnosis Date  . CTS (carpal tunnel syndrome)   . Gout   . HSV-2 (herpes simplex virus 2) infection   . Inguinal hernia    Past Surgical History:  Procedure Laterality Date  . BONE SPUR     Left Foot  . CARPAL TUNNEL RELEASE Left   . CARPAL TUNNEL RELEASE     Left Hand  . HERNIA REPAIR    . TOE SURGERY Right    Social History   Socioeconomic History  . Marital status: Married    Spouse name: Not on file  . Number of children: 3  . Years of education: Not on file  . Highest education level: Not on file  Occupational History  . Not on file  Social Needs  . Financial resource strain: Not on file  . Food insecurity:    Worry: Not on file    Inability: Not on file  . Transportation needs:    Medical: Not on file    Non-medical: Not on file  Tobacco Use  . Smoking status: Former Smoker    Packs/day: 1.00    Years: 15.00    Pack years: 15.00    Types: Cigarettes  . Smokeless tobacco: Former Engineer, water and Sexual Activity  . Alcohol use: Yes    Alcohol/week: 1.0 standard drinks    Types: 1 Standard drinks or equivalent per week    Comment: 2 drinks/month  . Drug use: Yes    Types: Marijuana    Comment: marijuana -- every day use  . Sexual activity: Not Currently    Partners: Female  Lifestyle  . Physical activity:    Days per week: Not on file    Minutes per session: Not on file  . Stress: Not on file  Relationships  . Social connections:    Talks on phone: Not on file    Gets together: Not on file   Attends religious service: Not on file    Active member of club or organization: Not on file    Attends meetings of clubs or organizations: Not on file    Relationship status: Not on file  Other Topics Concern  . Not on file  Social History Narrative   ** Merged History Encounter **       No Known Allergies Family History  Problem Relation Age of Onset  . Heart disease Paternal Grandmother        Enlarged Heart  . Healthy Mother   . Suicidality Father        Deceased  . Breast cancer Maternal Grandmother   . Hypertension Paternal Grandfather   . Diabetes Paternal Grandfather   . Hypertension Maternal Grandfather   . Diabetes Maternal Grandfather   . Healthy Brother        x1 Half  . Healthy Sister        x2  . Healthy Daughter        x2  . Healthy Son        x1  Current Outpatient Medications (Other):  .  cyclobenzaprine (FLEXERIL) 10 MG tablet, Take 1 tablet (10 mg total) by mouth at bedtime. .  Diclofenac Sodium 2 % SOLN, Place 2 g onto the skin 2 (two) times daily. Marland Kitchen  gabapentin (NEURONTIN) 100 MG capsule, Take 2 capsules (200 mg total) by mouth at bedtime. .  hydrOXYzine (ATARAX/VISTARIL) 25 MG tablet, Take 1 tablet (25 mg total) by mouth 3 (three) times daily as needed. .  valACYclovir (VALTREX) 1000 MG tablet, TAKE 1 TABLET BY MOUTH DAILY .  Vitamin D, Ergocalciferol, (DRISDOL) 50000 units CAPS capsule, Take 1 capsule (50,000 Units total) by mouth every 7 (seven) days.    Past medical history, social, surgical and family history all reviewed in electronic medical record.  No pertanent information unless stated regarding to the chief complaint.   Review of Systems:  No headache, visual changes, nausea, vomiting, diarrhea, constipation, dizziness, abdominal pain, skin rash, fevers, chills, night sweats, weight loss, swollen lymph nodes, body aches, joint swelling, muscle aches, chest pain, shortness of breath, mood changes.   Objective  There were no  vitals taken for this visit. Systems examined below as of    General: No apparent distress alert and oriented x3 mood and affect normal, dressed appropriately.  HEENT: Pupils equal, extraocular movements intact  Respiratory: Patient's speak in full sentences and does not appear short of breath  Cardiovascular: No lower extremity edema, non tender, no erythema  Skin: Warm dry intact with no signs of infection or rash on extremities or on axial skeleton.  Abdomen: Soft nontender  Neuro: Cranial nerves II through XII are intact, neurovascularly intact in all extremities with 2+ DTRs and 2+ pulses.  Lymph: No lymphadenopathy of posterior or anterior cervical chain or axillae bilaterally.  Gait normal with good balance and coordination.  MSK:  Non tender with full range of motion and good stability and symmetric strength and tone of shoulders, elbows, wrist, hip, knee and ankles bilaterally.  Neck: Inspection unremarkable. No palpable stepoffs. Negative Spurling's maneuver. Full neck range of motion Grip strength and sensation normal in bilateral hands Strength good C4 to T1 distribution No sensory change to C4 to T1 Negative Hoffman sign bilaterally Reflexes normal   Impression and Recommendations:     This case required medical decision making of moderate complexity. The above documentation has been reviewed and is accurate and complete Judi Saa, DO       Note: This dictation was prepared with Dragon dictation along with smaller phrase technology. Any transcriptional errors that result from this process are unintentional.

## 2018-07-30 ENCOUNTER — Ambulatory Visit: Payer: BLUE CROSS/BLUE SHIELD | Admitting: Family Medicine

## 2018-12-21 ENCOUNTER — Ambulatory Visit (INDEPENDENT_AMBULATORY_CARE_PROVIDER_SITE_OTHER): Payer: BLUE CROSS/BLUE SHIELD | Admitting: Physician Assistant

## 2018-12-21 ENCOUNTER — Encounter: Payer: Self-pay | Admitting: Physician Assistant

## 2018-12-21 VITALS — Ht 67.0 in | Wt 210.0 lb

## 2018-12-21 DIAGNOSIS — M109 Gout, unspecified: Secondary | ICD-10-CM

## 2018-12-21 MED ORDER — PREDNISONE 10 MG PO TABS: ORAL_TABLET | ORAL | 0 refills | Status: AC

## 2018-12-21 NOTE — Patient Instructions (Addendum)
Instructions sent to MyChart.   Take the steroid taper as directed. Tylenol for breakthrough pain. Avoid alcohol, trigger foods (red meat in your case). See recommendations below.  Follow-up next week if not resolving. Once resolved we need to get you in for assessment and to check your uric acid levels to see if a preventive medication is needed.    Gout  Gout is painful swelling of your joints. Gout is a type of arthritis. It is caused by having too much uric acid in your body. Uric acid is a chemical that is made when your body breaks down substances called purines. If your body has too much uric acid, sharp crystals can form and build up in your joints. This causes pain and swelling. Gout attacks can happen quickly and be very painful (acute gout). Over time, the attacks can affect more joints and happen more often (chronic gout). What are the causes?  Too much uric acid in your blood. This can happen because: ? Your kidneys do not remove enough uric acid from your blood. ? Your body makes too much uric acid. ? You eat too many foods that are high in purines. These foods include organ meats, some seafood, and beer.  Trauma or stress. What increases the risk?  Having a family history of gout.  Being male and middle-aged.  Being male and having gone through menopause.  Being very overweight (obese).  Drinking alcohol, especially beer.  Not having enough water in the body (being dehydrated).  Losing weight too quickly.  Having an organ transplant.  Having lead poisoning.  Taking certain medicines.  Having kidney disease.  Having a skin condition called psoriasis. What are the signs or symptoms? An attack of acute gout usually happens in just one joint. The most common place is the big toe. Attacks often start at night. Other joints that may be affected include joints of the feet, ankle, knee, fingers, wrist, or elbow. Symptoms of an attack may include:  Very bad  pain.  Warmth.  Swelling.  Stiffness.  Shiny, red, or purple skin.  Tenderness. The affected joint may be very painful to touch.  Chills and fever. Chronic gout may cause symptoms more often. More joints may be involved. You may also have white or yellow lumps (tophi) on your hands or feet or in other areas near your joints. How is this treated?  Treatment for this condition has two phases: treating an acute attack and preventing future attacks.  Acute gout treatment may include: ? NSAIDs. ? Steroids. These are taken by mouth or injected into a joint. ? Colchicine. This medicine relieves pain and swelling. It can be given by mouth or through an IV tube.  Preventive treatment may include: ? Taking small doses of NSAIDs or colchicine daily. ? Using a medicine that reduces uric acid levels in your blood. ? Making changes to your diet. You may need to see a food expert (dietitian) about what to eat and drink to prevent gout. Follow these instructions at home: During a gout attack   If told, put ice on the painful area: ? Put ice in a plastic bag. ? Place a towel between your skin and the bag. ? Leave the ice on for 20 minutes, 2-3 times a day.  Raise (elevate) the painful joint above the level of your heart as often as you can.  Rest the joint as much as possible. If the joint is in your leg, you may be given crutches.  Follow instructions from your doctor about what you cannot eat or drink. Avoiding future gout attacks  Eat a low-purine diet. Avoid foods and drinks such as: ? Liver. ? Kidney. ? Anchovies. ? Asparagus. ? Herring. ? Mushrooms. ? Mussels. ? Beer.  Stay at a healthy weight. If you want to lose weight, talk with your doctor. Do not lose weight too fast.  Start or continue an exercise plan as told by your doctor. Eating and drinking  Drink enough fluids to keep your pee (urine) pale yellow.  If you drink alcohol: ? Limit how much you use to:  0-1  drink a day for women.  0-2 drinks a day for men. ? Be aware of how much alcohol is in your drink. In the [place] one drink equals one 12 oz bottle of beer (355 mL), one 5 oz glass of wine (148 mL), or one 1 oz glass of hard liquor (44 mL). General instructions  Take over-the-counter and prescription medicines only as told by your doctor.  Do not drive or use heavy machinery while taking prescription pain medicine.  Return to your normal activities as told by your doctor. Ask your doctor what activities are safe for you.  Keep all follow-up visits as told by your doctor. This is important. Contact a doctor if:  You have another gout attack.  You still have symptoms of a gout attack after 10 days of treatment.  You have problems (side effects) because of your medicines.  You have chills or a fever.  You have burning pain when you pee (urinate).  You have pain in your lower back or belly. Get help right away if:  You have very bad pain.  Your pain cannot be controlled.  You cannot pee. Summary  Gout is painful swelling of the joints.  The most common site of pain is the big toe, but it can affect other joints.  Medicines and avoiding some foods can help to prevent and treat gout attacks. This information is not intended to replace advice given to you by your health care provider. Make sure you discuss any questions you have with your health care provider. Document Released: 01/26/2008 Document Revised: 11/08/2017 Document Reviewed: 11/08/2017 Elsevier Patient Education  2020 [employer].

## 2018-12-21 NOTE — Progress Notes (Signed)
Virtual Visit via Video   I connected with patient on 12/21/18 at  8:30 AM EDT by a video enabled telemedicine application and verified that I am speaking with the correct person using two identifiers.  Location patient: Home Location provider: [facility] Persons participating in the virtual visit: Patient, Provider, [place] ([place])  I discussed the limitations of evaluation and management by telemedicine and the availability of in person appointments. The patient expressed understanding and agreed to proceed.  Subjective:   HPI:  Patient presents via [facility] today c/o flare of gout a few weeks ago of left great and 2nd toe taking 3 days to resolve. Over the past few days has noted a recurrence in flare of redness, pain and swelling of two middle toes on left foot. Denies any trauma or injury. Denies fever, chill, malaise. Pain with ROM without decreased ROM. Alcohol consumption -- No increase. Increase in dietary purines and some red meat.  ROS:  See pertinent positives and negatives per HPI.  Patient Active Problem List   Diagnosis Date Noted  . Chronic neck pain 06/28/2018  . Nonallopathic lesion of cervical region 06/28/2018  . Nonallopathic lesion of thoracic region 06/28/2018  . Nonallopathic lesion of rib cage 06/28/2018  . Bilateral shoulder bursitis 05/30/2018  . Gout 05/30/2018  . Left rotator cuff tear 12/13/2017  . Right cervical radiculopathy 12/13/2017  . Medial epicondylitis of elbow, right 12/24/2016  . Insomnia 12/24/2016  . Tobacco use disorder 12/24/2016  . HSV-2 (herpes simplex virus 2) infection 05/20/2016  . Inguinal hernia - right     Social History   Tobacco Use  . Smoking status: Former Smoker    Packs/day: 1.00    Years: 15.00    Pack years: 15.00    Types: Cigarettes  . Smokeless tobacco: Former Network engineer Use Topics  . Alcohol use: Yes    Alcohol/week: 1.0 standard drinks    Types: 1 Standard drinks or  equivalent per week    Comment: 2 drinks/month    Current Outpatient Medications:  .  Diclofenac Sodium 2 % SOLN, Place 2 g onto the skin 2 (two) times daily., Disp: 112 g, Rfl: 3 .  valACYclovir (VALTREX) 1000 MG tablet, TAKE 1 TABLET BY MOUTH DAILY, Disp: 90 tablet, Rfl: 0 .  cyclobenzaprine (FLEXERIL) 10 MG tablet, Take 1 tablet (10 mg total) by mouth at bedtime., Disp: 30 tablet, Rfl: 0 .  gabapentin (NEURONTIN) 100 MG capsule, Take 2 capsules (200 mg total) by mouth at bedtime. (Patient not taking: Reported on 12/21/2018), Disp: 60 capsule, Rfl: 3 .  hydrOXYzine (ATARAX/VISTARIL) 25 MG tablet, Take 1 tablet (25 mg total) by mouth 3 (three) times daily as needed. (Patient not taking: Reported on 12/21/2018), Disp: 30 tablet, Rfl: 1 .  Vitamin D, Ergocalciferol, (DRISDOL) 50000 units CAPS capsule, Take 1 capsule (50,000 Units total) by mouth every 7 (seven) days., Disp: 12 capsule, Rfl: 0  No Known Allergies  Objective:   Ht 5\' 7"  (1.702 m)   Wt 210 lb (95.3 kg)   BMI 32.89 kg/m   Patient is well-developed, well-nourished in no acute distress.  Resting comfortably at home.  Head is normocephalic, atraumatic.  No labored breathing.  Speech is clear and coherent with logical contest.  Patient is alert and oriented at baseline.   Assessment and Plan:   1. Acute gout of left foot, unspecified cause Classic symptoms for him. Start prednisone taper. Tylenol for breakthrough pain. Dietary recommendations reviewed. Follow-up 2  weeks after resolution for uric acid levels.  - predniSONE (DELTASONE) 10 MG tablet; Take 4 tablets (40 mg total) by mouth daily with breakfast for 2 days, THEN 3 tablets (30 mg total) daily with breakfast for 2 days, THEN 2 tablets (20 mg total) daily with breakfast for 2 days, THEN 1 tablet (10 mg total) daily with breakfast for 2 days.  Dispense: 20 tablet; Refill: 0    Piedad Climes, [place] 12/21/2018

## 2018-12-21 NOTE — Progress Notes (Signed)
I have discussed the procedure for the virtual visit with the patient who has given consent to proceed with assessment and treatment.   BETHANY DILLARD, CMA     

## 2018-12-27 ENCOUNTER — Encounter: Payer: Self-pay | Admitting: Physician Assistant

## 2018-12-31 ENCOUNTER — Encounter: Payer: Self-pay | Admitting: Physician Assistant

## 2018-12-31 NOTE — Telephone Encounter (Signed)
Needs appt if symptoms are not resolving with previous treatment given. Recommend in-office assessment.

## 2018-12-31 NOTE — Telephone Encounter (Signed)
Patient scheduled for an in office appointment with PCP on 01/01/19 @ 2:30 p

## 2019-01-01 ENCOUNTER — Encounter: Payer: Self-pay | Admitting: Physician Assistant

## 2019-01-01 ENCOUNTER — Ambulatory Visit (INDEPENDENT_AMBULATORY_CARE_PROVIDER_SITE_OTHER): Payer: BLUE CROSS/BLUE SHIELD | Admitting: Physician Assistant

## 2019-01-01 ENCOUNTER — Inpatient Hospital Stay (HOSPITAL_COMMUNITY): Admit: 2019-01-01 | Payer: BLUE CROSS/BLUE SHIELD

## 2019-01-01 ENCOUNTER — Other Ambulatory Visit: Payer: Self-pay

## 2019-01-01 VITALS — BP 122/80 | HR 85 | Temp 99.2°F | Resp 16 | Ht 67.0 in | Wt 191.0 lb

## 2019-01-01 DIAGNOSIS — Z113 Encounter for screening for infections with a predominantly sexual mode of transmission: Secondary | ICD-10-CM | POA: Diagnosis not present

## 2019-01-01 DIAGNOSIS — M2550 Pain in unspecified joint: Secondary | ICD-10-CM

## 2019-01-01 DIAGNOSIS — Z8739 Personal history of other diseases of the musculoskeletal system and connective tissue: Secondary | ICD-10-CM | POA: Diagnosis not present

## 2019-01-01 DIAGNOSIS — Z23 Encounter for immunization: Secondary | ICD-10-CM

## 2019-01-01 MED ORDER — INDOMETHACIN 50 MG PO CAPS: ORAL_CAPSULE | ORAL | 0 refills | Status: DC

## 2019-01-01 NOTE — Patient Instructions (Signed)
Please go to the lab today for blood work.  I will call you with your results. We will alter treatment regimen(s) if indicated by your results.   Please keep hydrated and get plenty of rest. Avoid red meat, alcohol, tomato-based foods and shellfish. Take the indomethacin as directed. Tylenol for breakthrough pain. Ice and elevate legs while resting.  The labs will help [place] further differentiate if this is gouty arthritis, another inflammatory arthritis or osteoarthritis.

## 2019-01-01 NOTE — Progress Notes (Signed)
Patient presents to clinic today c/o recurring pain and swelling of R knee, intermittent with pain and stiffness of knees bilaterally and feet bilaterally. Patient with history of gout. No other known inflammatory arthropathy. Was recently treated for suspected acute flare in his foot with prednisone. Notes improvement in symptoms with this regimen but still having pain and stiffness of feet without redness or swelling. Also notes intermittent puffiness and pain in R knee without history of trauma or injury. Seems the arthropathy migrates. Denies fever, chills, fatigue. No alcohol. Is avoiding triggers for gout. Is keeping hydrating.   Past Medical History:  Diagnosis Date  . CTS (carpal tunnel syndrome)   . Gout   . HSV-2 (herpes simplex virus 2) infection   . Inguinal hernia     Current Outpatient Medications on File Prior to Visit  Medication Sig Dispense Refill  . valACYclovir (VALTREX) 1000 MG tablet TAKE 1 TABLET BY MOUTH DAILY 90 tablet 0   No current facility-administered medications on file prior to visit.     No Known Allergies  Family History  Problem Relation Age of Onset  . Heart disease Paternal Grandmother        Enlarged Heart  . Healthy Mother   . Suicidality Father        Deceased  . Breast cancer Maternal Grandmother   . Hypertension Paternal Grandfather   . Diabetes Paternal Grandfather   . Hypertension Maternal Grandfather   . Diabetes Maternal Grandfather   . Healthy Brother        x1 Half  . Healthy Sister        x2  . Healthy Daughter        x2  . Healthy Son        x1    Social History   Socioeconomic History  . Marital status: Married    Spouse name: Not on file  . Number of children: 3  . Years of education: Not on file  . Highest education level: Not on file  Occupational History  . Not on file  Social Needs  . Financial resource strain: Not on file  . Food insecurity    Worry: Not on file    Inability: Not on file  .  Transportation needs    Medical: Not on file    Non-medical: Not on file  Tobacco Use  . Smoking status: Former Smoker    Packs/day: 1.00    Years: 15.00    Pack years: 15.00    Types: Cigarettes  . Smokeless tobacco: Former Engineer, water and Sexual Activity  . Alcohol use: Yes    Alcohol/week: 1.0 standard drinks    Types: 1 Standard drinks or equivalent per week    Comment: 2 drinks/month  . Drug use: Yes    Types: Marijuana    Comment: marijuana -- every day use  . Sexual activity: Not Currently    Partners: Female  Lifestyle  . Physical activity    Days per week: Not on file    Minutes per session: Not on file  . Stress: Not on file  Relationships  . Social Musician on phone: Not on file    Gets together: Not on file    Attends religious service: Not on file    Active member of club or organization: Not on file    Attends meetings of clubs or organizations: Not on file    Relationship status: Not on file  Other Topics  Concern  . Not on file  Social History Narrative   ** Merged History Encounter **        Review of Systems - See HPI.  All other ROS are negative.  BP 122/80   Pulse 85   Temp 99.2 F (37.3 C) (Skin)   Resp 16   Ht 5\' 7"  (1.702 m)   Wt 191 lb (86.6 kg)   SpO2 98%   BMI 29.91 kg/m   Physical Exam  Assessment/Plan: 1. Polyarthralgia 2. History of gout History of gout but no sign of gout flare on exam today. Gout be gouty arthritis itself from prior episodes or other type of inflammatory arthropathy. Will check full lab panel today. Rx indomethacin for acute pain. Supportive measures reviewed. May need imaging if labs unremarkable.  - Basic metabolic panel - Sedimentation rate - Rheumatoid Factor - B. burgdorfi antibodies - Uric acid  3. Screen for STD (sexually transmitted disease) Requested at end of visit. No current concerns but likes to have checked yearly. Orders placed. Safe sex practices reviewed.  - HIV Antibody  (routine testing w rflx) - Acute Hep Panel & Hep B Surface Ab - RPR - Urine cytology ancillary only([facility])  4. Need for immunization against influenza - Flu Vaccine QUAD 36+ mos IM   Leeanne Rio, PA-C

## 2019-01-02 LAB — ACUTE HEP PANEL AND HEP B SURFACE AB
HEPATITIS C ANTIBODY REFILL$(REFL): NONREACTIVE
Hep A IgM: NONREACTIVE
Hep B C IgM: NONREACTIVE
Hepatitis B Surface Ag: NONREACTIVE
SIGNAL TO CUT-OFF: 0.02 (ref <1.00)

## 2019-01-02 LAB — BASIC METABOLIC PANEL
BUN: 14 mg/dL (ref 6–23)
CO2: 24 mEq/L (ref 19–32)
Calcium: 9.4 mg/dL (ref 8.4–10.5)
Chloride: 103 mEq/L (ref 96–112)
Creatinine, Ser: 0.99 mg/dL (ref 0.40–1.50)
GFR: 84.8 mL/min (ref >60.00)
Glucose, Bld: 112 mg/dL — ABNORMAL HIGH (ref 70–99)
Potassium: 3.9 mEq/L (ref 3.5–5.1)
Sodium: 139 mEq/L (ref 135–145)

## 2019-01-02 LAB — RPR: RPR Ser Ql: NONREACTIVE

## 2019-01-02 LAB — HIV ANTIBODY (ROUTINE TESTING W REFLEX): HIV 1&2 Ab, 4th Generation: NONREACTIVE

## 2019-01-02 LAB — REFLEX TIQ

## 2019-01-02 LAB — RHEUMATOID FACTOR: Rhuematoid fact SerPl-aCnc: <14 IU/mL (ref <14)

## 2019-01-02 LAB — B. BURGDORFI ANTIBODIES: B burgdorferi Ab IgG+IgM: <0.9 index

## 2019-01-02 LAB — SEDIMENTATION RATE: Sed Rate: 37 mm/hr — ABNORMAL HIGH (ref 0–15)

## 2019-01-02 LAB — URIC ACID: Uric Acid, Serum: 5.7 mg/dL (ref 4.0–7.8)

## 2019-01-03 LAB — URINE CYTOLOGY ANCILLARY ONLY
Chlamydia: POSITIVE — AB
Neisseria Gonorrhea: NEGATIVE

## 2019-01-04 ENCOUNTER — Encounter: Payer: Self-pay | Admitting: Emergency Medicine

## 2019-01-04 ENCOUNTER — Other Ambulatory Visit: Payer: Self-pay | Admitting: Emergency Medicine

## 2019-01-04 ENCOUNTER — Encounter: Payer: Self-pay | Admitting: Physician Assistant

## 2019-01-04 DIAGNOSIS — A749 Chlamydial infection, unspecified: Secondary | ICD-10-CM

## 2019-01-04 MED ORDER — AZITHROMYCIN 500 MG PO TABS: 1000.0000 mg | ORAL_TABLET | Freq: Once | ORAL | 0 refills | Status: AC

## 2019-01-21 ENCOUNTER — Encounter: Payer: Self-pay | Admitting: Physician Assistant

## 2019-01-21 DIAGNOSIS — M2550 Pain in unspecified joint: Secondary | ICD-10-CM

## 2019-01-23 ENCOUNTER — Other Ambulatory Visit: Payer: Self-pay

## 2019-01-23 ENCOUNTER — Ambulatory Visit (INDEPENDENT_AMBULATORY_CARE_PROVIDER_SITE_OTHER): Payer: BLUE CROSS/BLUE SHIELD

## 2019-01-23 ENCOUNTER — Other Ambulatory Visit: Payer: BLUE CROSS/BLUE SHIELD

## 2019-01-23 ENCOUNTER — Other Ambulatory Visit: Payer: Self-pay | Admitting: *Deleted

## 2019-01-23 ENCOUNTER — Encounter: Payer: Self-pay | Admitting: Physician Assistant

## 2019-01-23 DIAGNOSIS — M79671 Pain in right foot: Secondary | ICD-10-CM

## 2019-01-23 DIAGNOSIS — M79672 Pain in left foot: Secondary | ICD-10-CM | POA: Diagnosis not present

## 2019-01-23 DIAGNOSIS — Z8739 Personal history of other diseases of the musculoskeletal system and connective tissue: Secondary | ICD-10-CM | POA: Diagnosis not present

## 2019-01-23 DIAGNOSIS — M2550 Pain in unspecified joint: Secondary | ICD-10-CM

## 2019-01-24 ENCOUNTER — Other Ambulatory Visit: Payer: Self-pay | Admitting: Emergency Medicine

## 2019-01-24 DIAGNOSIS — M25561 Pain in right knee: Secondary | ICD-10-CM

## 2019-01-24 DIAGNOSIS — M25562 Pain in left knee: Secondary | ICD-10-CM

## 2019-01-24 DIAGNOSIS — M069 Rheumatoid arthritis, unspecified: Secondary | ICD-10-CM

## 2019-01-24 DIAGNOSIS — M79673 Pain in unspecified foot: Secondary | ICD-10-CM

## 2019-01-24 DIAGNOSIS — M722 Plantar fascial fibromatosis: Secondary | ICD-10-CM

## 2019-01-29 ENCOUNTER — Ambulatory Visit (INDEPENDENT_AMBULATORY_CARE_PROVIDER_SITE_OTHER): Payer: BLUE CROSS/BLUE SHIELD | Admitting: Family

## 2019-01-29 ENCOUNTER — Encounter: Payer: Self-pay | Admitting: Family

## 2019-01-29 VITALS — Ht 67.0 in | Wt 191.0 lb

## 2019-01-29 DIAGNOSIS — M25561 Pain in right knee: Secondary | ICD-10-CM

## 2019-01-29 DIAGNOSIS — G8929 Other chronic pain: Secondary | ICD-10-CM | POA: Diagnosis not present

## 2019-01-29 DIAGNOSIS — M1A9XX0 Chronic gout, unspecified, without tophus (tophi): Secondary | ICD-10-CM

## 2019-01-29 DIAGNOSIS — M79671 Pain in right foot: Secondary | ICD-10-CM

## 2019-01-29 MED ORDER — LIDOCAINE HCL 1 % IJ SOLN
5.0000 mL | INTRAMUSCULAR | Status: AC | PRN
  Administered 2019-01-29: 5 mL

## 2019-01-29 MED ORDER — METHYLPREDNISOLONE ACETATE 40 MG/ML IJ SUSP
40.0000 mg | INTRAMUSCULAR | Status: AC | PRN
  Administered 2019-01-29: 40 mg via INTRA_ARTICULAR

## 2019-01-29 NOTE — Progress Notes (Signed)
Office Visit Note   Patient: Zachary Mason           Date of Birth: October 03, 1980           MRN: [id number] Visit Date: 01/29/2019              Requested by: Brunetta Jeans, PA-C [id number] [place],   [id number] PCP: Brunetta Jeans, PA-C  Chief Complaint  Patient presents with  . Left Foot - Pain  . Right Foot - Pain  . Left Knee - Pain  . Right Knee - Pain      HPI: The patient is a 38 year old gentleman seen today for initial evaluation of right knee and bilateral foot evaluation.  Having global right knee pain with some swelling there is no erythema no warmth associated with this pain with extension and deep flexion.  Complains of bilateral toe pain as well not his great toes however the second and fourth toes to the bottom of these this is only with ambulation pain with start up states this eases over the course of the day.   Has not been on any medications for his gout in the past has had a course of prednisone which is quieted his symptoms.  Most recent uric acid from last week was 5.7.   Had a history of gout states this is been aspirated confirmed gout many years ago.  He has had a history of gouty issues with his great toe in the past.    Assessment & Plan: Visit Diagnoses:  1. Chronic pain of right knee     Plan: Depo-Medrol injection right knee patient tolerated well follow-up in the office in 3 weeks we will reevaluate his symptoms.  Discussed heel cord stretching feel that the forefoot pain is related to heel cord tightness he has some new supportive hiking shoes that he will be wearing for work.  We will consider treating his gout with colchicine if still having foot and knee pain next week  Follow-Up Instructions: Return in about 3 weeks (around 02/19/2019).   Right Knee Exam   Range of Motion  The patient has normal right knee ROM. Right knee extension: painful.   Tests  Varus: negative Valgus: negative Drawer:  Anterior - negative     Posterior - negative  Other  Erythema: absent Swelling: mild      Patient is alert, oriented, no adenopathy, well-dressed, normal affect, normal respiratory effort. Does have heel cord tightness bilaterally. Mild tenderness to plantar proximal phalanges of 2 and 4th toes bilaterally. No erythema. No clawing.   Imaging: No results found. No images are attached to the encounter.  Labs: Lab Results  Component Value Date   HGBA1C 5.2 06/10/2016   ESRSEDRATE 37 (H) 01/01/2019   LABURIC 5.7 01/01/2019     Lab Results  Component Value Date   ALBUMIN 4.5 06/10/2016   LABURIC 5.7 01/01/2019    No results found for: MG No results found for: VD25OH  No results found for: PREALBUMIN CBC EXTENDED Latest Ref Rng & Units 06/20/2016 06/10/2016  WBC 4.0 - 10.5 K/uL 5.4 5.9  RBC 4.22 - 5.81 Mil/uL 5.39 5.46  HGB 13.0 - 17.0 g/dL 17.2(H) 17.4(H)  HCT 39.0 - 52.0 % 50.5 51.2  PLT 150.0 - 400.0 K/uL 266.0 286.0  NEUTROABS 1.4 - 7.7 K/uL 2.5 -  LYMPHSABS 0.7 - 4.0 K/uL 2.0 -     Body mass index is 29.91 kg/m.  Orders:  No orders of the defined types were placed in this encounter.  No orders of the defined types were placed in this encounter.    Procedures: Large Joint Inj: R knee on 01/29/2019 12:35 PM Indications: pain Details: 18 G 1.5 in needle, anteromedial approach Medications: 5 mL lidocaine 1 %; 40 mg methylPREDNISolone acetate 40 MG/ML Consent was given by the patient.      Clinical Data: No additional findings.  ROS:  All other systems negative, except as noted in the HPI. Review of Systems  Constitutional: Negative for chills and fever.  Musculoskeletal: Positive for arthralgias and joint swelling.    Objective: Vital Signs: Ht 5\' 7"  (1.702 m)   Wt 191 lb (86.6 kg)   BMI 29.91 kg/m   Specialty Comments:  No specialty comments available.  PMFS History: Patient Active Problem List   Diagnosis Date Noted  . Chronic neck pain 06/28/2018  .  Nonallopathic lesion of cervical region 06/28/2018  . Nonallopathic lesion of thoracic region 06/28/2018  . Nonallopathic lesion of rib cage 06/28/2018  . Bilateral shoulder bursitis 05/30/2018  . Gout 05/30/2018  . Left rotator cuff tear 12/13/2017  . Right cervical radiculopathy 12/13/2017  . Medial epicondylitis of elbow, right 12/24/2016  . Insomnia 12/24/2016  . Tobacco use disorder 12/24/2016  . HSV-2 (herpes simplex virus 2) infection 05/20/2016  . Inguinal hernia - right    Past Medical History:  Diagnosis Date  . CTS (carpal tunnel syndrome)   . Gout   . HSV-2 (herpes simplex virus 2) infection   . Inguinal hernia     Family History  Problem Relation Age of Onset  . Heart disease Paternal Grandmother        Enlarged Heart  . Healthy Mother   . Suicidality Father        Deceased  . Breast cancer Maternal Grandmother   . Hypertension Paternal Grandfather   . Diabetes Paternal Grandfather   . Hypertension Maternal Grandfather   . Diabetes Maternal Grandfather   . Healthy Brother        x1 Half  . Healthy Sister        x2  . Healthy Daughter        x2  . Healthy Son        x1    Past Surgical History:  Procedure Laterality Date  . BONE SPUR     Left Foot  . CARPAL TUNNEL RELEASE Left   . CARPAL TUNNEL RELEASE     Left Hand  . HERNIA REPAIR    . TOE SURGERY Right    Social History   Occupational History  . Not on file  Tobacco Use  . Smoking status: Former Smoker    Packs/day: 1.00    Years: 15.00    Pack years: 15.00    Types: Cigarettes  . Smokeless tobacco: Former 05/22/2016 and Sexual Activity  . Alcohol use: Yes    Alcohol/week: 1.0 standard drinks    Types: 1 Standard drinks or equivalent per week    Comment: 2 drinks/month  . Drug use: Yes    Types: Marijuana    Comment: marijuana -- every day use  . Sexual activity: Not Currently    Partners: Female

## 2019-02-11 ENCOUNTER — Ambulatory Visit (INDEPENDENT_AMBULATORY_CARE_PROVIDER_SITE_OTHER): Payer: BLUE CROSS/BLUE SHIELD | Admitting: Podiatry

## 2019-02-11 ENCOUNTER — Other Ambulatory Visit: Payer: Self-pay | Admitting: Podiatry

## 2019-02-11 ENCOUNTER — Other Ambulatory Visit: Payer: Self-pay

## 2019-02-11 DIAGNOSIS — M1A9XX0 Chronic gout, unspecified, without tophus (tophi): Secondary | ICD-10-CM

## 2019-02-11 DIAGNOSIS — M25572 Pain in left ankle and joints of left foot: Secondary | ICD-10-CM

## 2019-02-11 DIAGNOSIS — M7751 Other enthesopathy of right foot: Secondary | ICD-10-CM

## 2019-02-11 DIAGNOSIS — M722 Plantar fascial fibromatosis: Secondary | ICD-10-CM

## 2019-02-11 DIAGNOSIS — M2041 Other hammer toe(s) (acquired), right foot: Secondary | ICD-10-CM | POA: Diagnosis not present

## 2019-02-11 DIAGNOSIS — M25571 Pain in right ankle and joints of right foot: Secondary | ICD-10-CM

## 2019-02-11 DIAGNOSIS — M7752 Other enthesopathy of left foot: Secondary | ICD-10-CM | POA: Diagnosis not present

## 2019-02-11 DIAGNOSIS — M2042 Other hammer toe(s) (acquired), left foot: Secondary | ICD-10-CM

## 2019-02-11 MED ORDER — METHYLPREDNISOLONE 4 MG PO TBPK: ORAL_TABLET | ORAL | 0 refills | Status: DC

## 2019-02-11 NOTE — Progress Notes (Signed)
  Subjective:  Patient ID: Zachary Mason, male    DOB: [birth year],  MRN: [id number]  Chief Complaint  Patient presents with  . Foot Pain    BL 2nd and 4th toes redness,swelling and pian (L>R) x 2 mo; 5-10/10 shapr burning stabbing pains -pt denies numbness or injury -worse from resting to standing -w/. Rt knee problmes Tx: none     38 y.o. male presents with the above complaint. Hx as above.  Per patient he has hx of gout confirmed by joint aspirate.  Review of Systems: Negative except as noted in the HPI. Denies N/V/F/Ch.  Past Medical History:  Diagnosis Date  . CTS (carpal tunnel syndrome)   . Gout   . HSV-2 (herpes simplex virus 2) infection   . Inguinal hernia     Current Outpatient Medications:  .  methylPREDNISolone (MEDROL DOSEPAK) 4 MG TBPK tablet, 6 Day Taper Pack. Take as Directed., Disp: 21 tablet, Rfl: 0 .  valACYclovir (VALTREX) 1000 MG tablet, TAKE 1 TABLET BY MOUTH DAILY, Disp: 90 tablet, Rfl: 0  Social History   Tobacco Use  Smoking Status Former Smoker  . Packs/day: 1.00  . Years: 15.00  . Pack years: 15.00  . Types: Cigarettes  Smokeless Tobacco Former User    No Known Allergies Objective:  There were no vitals filed for this visit. There is no height or weight on file to calculate BMI. Constitutional Well developed. Well nourished.  Vascular Dorsalis pedis pulses palpable bilaterally. Posterior tibial pulses palpable bilaterally. Capillary refill normal to all digits.  No cyanosis or clubbing noted. Pedal hair growth normal.  Neurologic Normal speech. Oriented to person, place, and time. Epicritic sensation to light touch grossly present bilaterally.  Dermatologic Nails well groomed and normal in appearance. No open wounds. No skin lesions.  Orthopedic: POP 2nd MPJ bilat, with edema, lesser extent at 4th MPJ Hammertoe deformities 2nd 4th toes with crepitus at PIPJ bilat   Radiographs: Prior XR reviewed. Assessment:   1. Capsulitis of  metatarsophalangeal (MTP) joints of both feet   2. Pain in joints of both feet   3. Hammer toes of both feet   4. Chronic gout without tophus, unspecified cause, unspecified site    Plan:  Patient was evaluated and treated and all questions answered.  Capsulitis 2nd MPJ bilat; possible gout -XR reviewed -Injection delivered bilat 2nd MPJ -F/u in 3 weeks for recheck -Should pain persists new WB XRs (patient had NWB films done 9/23).  No follow-ups on file.

## 2019-02-20 ENCOUNTER — Ambulatory Visit: Payer: BLUE CROSS/BLUE SHIELD | Admitting: Family

## 2019-03-05 ENCOUNTER — Other Ambulatory Visit: Payer: Self-pay | Admitting: Podiatry

## 2019-03-05 ENCOUNTER — Ambulatory Visit (INDEPENDENT_AMBULATORY_CARE_PROVIDER_SITE_OTHER): Payer: BLUE CROSS/BLUE SHIELD | Admitting: Podiatry

## 2019-03-05 ENCOUNTER — Other Ambulatory Visit: Payer: Self-pay

## 2019-03-05 DIAGNOSIS — Z5329 Procedure and treatment not carried out because of patient's decision for other reasons: Secondary | ICD-10-CM

## 2019-03-05 DIAGNOSIS — M779 Enthesopathy, unspecified: Secondary | ICD-10-CM

## 2019-03-05 NOTE — Progress Notes (Signed)
No show for appt. 

## 2019-03-19 ENCOUNTER — Other Ambulatory Visit: Payer: Self-pay | Admitting: Physician Assistant

## 2019-03-19 DIAGNOSIS — B009 Herpesviral infection, unspecified: Secondary | ICD-10-CM

## 2019-09-18 ENCOUNTER — Emergency Department (HOSPITAL_COMMUNITY): Payer: BLUE CROSS/BLUE SHIELD

## 2019-09-18 ENCOUNTER — Emergency Department (HOSPITAL_COMMUNITY)
Admission: EM | Admit: 2019-09-18 | Discharge: 2019-09-18 | Disposition: A | Discharge status: Home / Self Care | Payer: BLUE CROSS/BLUE SHIELD | Attending: Emergency Medicine | Admitting: Emergency Medicine

## 2019-09-18 DIAGNOSIS — Z79899 Other long term (current) drug therapy: Secondary | ICD-10-CM | POA: Insufficient documentation

## 2019-09-18 DIAGNOSIS — G479 Sleep disorder, unspecified: Secondary | ICD-10-CM | POA: Insufficient documentation

## 2019-09-18 DIAGNOSIS — G8929 Other chronic pain: Secondary | ICD-10-CM | POA: Insufficient documentation

## 2019-09-18 DIAGNOSIS — M542 Cervicalgia: Secondary | ICD-10-CM | POA: Diagnosis not present

## 2019-09-18 DIAGNOSIS — Z87891 Personal history of nicotine dependence: Secondary | ICD-10-CM | POA: Insufficient documentation

## 2019-09-18 DIAGNOSIS — R072 Precordial pain: Secondary | ICD-10-CM | POA: Diagnosis present

## 2019-09-18 LAB — TROPONIN I (HIGH SENSITIVITY)
Troponin I (High Sensitivity): 3 ng/L (ref <18)
Troponin I (High Sensitivity): 3 ng/L (ref <18)

## 2019-09-18 LAB — BASIC METABOLIC PANEL
Anion gap: 13 (ref 5–15)
BUN: 22 mg/dL — ABNORMAL HIGH (ref 6–20)
CO2: 22 mmol/L (ref 22–32)
Calcium: 9.3 mg/dL (ref 8.9–10.3)
Chloride: 107 mmol/L (ref 98–111)
Creatinine, Ser: 0.85 mg/dL (ref 0.61–1.24)
GFR calc Af Amer: >60 mL/min (ref >60)
GFR calc non Af Amer: >60 mL/min (ref >60)
Glucose, Bld: 102 mg/dL — ABNORMAL HIGH (ref 70–99)
Potassium: 4.1 mmol/L (ref 3.5–5.1)
Sodium: 142 mmol/L (ref 135–145)

## 2019-09-18 LAB — CBC
HCT: 51.9 % (ref 39.0–52.0)
Hemoglobin: 17.3 g/dL — ABNORMAL HIGH (ref 13.0–17.0)
MCH: 31.3 pg (ref 26.0–34.0)
MCHC: 33.3 g/dL (ref 30.0–36.0)
MCV: 94 fL (ref 80.0–100.0)
Platelets: 272 10*3/uL (ref 150–400)
RBC: 5.52 MIL/uL (ref 4.22–5.81)
RDW: 12.3 % (ref 11.5–15.5)
WBC: 6.4 10*3/uL (ref 4.0–10.5)
nRBC: 0 % (ref 0.0–0.2)

## 2019-09-18 MED ORDER — HYDROXYZINE HCL 25 MG PO TABS: 25.0000 mg | ORAL_TABLET | ORAL | 0 refills | Status: DC | PRN

## 2019-09-18 MED ORDER — SODIUM CHLORIDE 0.9% FLUSH: 3.0000 mL | Freq: Once | INTRAVENOUS | Status: DC

## 2019-09-18 NOTE — ED Triage Notes (Signed)
Pt here with c/o chest pain with c/o  Some slight son and nausea , pt also states that he has not been sleeping well

## 2019-09-18 NOTE — Discharge Instructions (Signed)
Follow-up with your primary care provider in regards to your chest pain.  If you have any new or worsening symptoms, specifically chest pain that is worse with exertion, unilateral leg swelling, redness, warmth, numbness in your extremities please seek reevaluation the emergency department  With regards to your sleeping I prescribed you a medication to help with this.  Return for new or worsening symptoms.

## 2019-09-18 NOTE — ED Provider Notes (Signed)
[facility] EMERGENCY DEPARTMENT Provider Note   CSN: [id number] Arrival date & time: 09/18/19  1013    History Chest pain  Zachary Mason is a 39 y.o. male with past medical history significant for chronic neck pain, gout who presents for evaluation of chest pain.  Patient states he was at work earlier today at approximately 9 AM when he developed right-sided chest pain.  Described as an aching sensation.  Patient states he had not actually started doing physical work prior to this.  Patient states that he did feel anxious and short of breath at this time.  Symptoms lasted approximately 1 hour and self resolved.  Pain located to right upper chest wall.  Patient states he has not been sleeping well.  Denies SI, HI, AVH.  Patient states by the time he gets home and after he takes a shower and eats he feels "wide-awake."  Patient states he tosses and turns at night.  He previously related his lack of sleep to his chronic neck pain.  Patient states his neck pain actually has not bothered him for "quite some time."  He has chronic posterior neck pain from compression fracture which he sustained years ago.  Patient denies any new recent injury or trauma.  Pain in his chest does not radiate into his neck.  Patient states that he does a vigorous job which is physically strenuous.  He denies any prior chest pain or shortness of breath with work or physical activities prior to this.  Pain does not radiate into neck or arm.  No associated diaphoresis, emesis, paresthesias or weakness.  He denies any paresthesias, illicit drug use, fever, chills, cough, hemoptysis abdominal pain, diarrhea, dysuria, unilateral leg swelling, redness, warmth, color changes to extremities.  Denies additional aggravating or relieving factors.  Rates current pain a 0/10.  No prior history of PE, DVT, recent surgery, immobilization, malignancy.  No chest pain radiating to back, paresthesias.  No known history of aneurysms  or dissections.  Patient denies prior history of hypertension, hyperlipidemia, diabetes, family history of MI at early age, illicit substance use.  History obtained from patient and past medical records.  No interpreter is used.  HPI     Past Medical History:  Diagnosis Date  . CTS (carpal tunnel syndrome)   . Gout   . HSV-2 (herpes simplex virus 2) infection   . Inguinal hernia     Patient Active Problem List   Diagnosis Date Noted  . Chronic neck pain 06/28/2018  . Nonallopathic lesion of cervical region 06/28/2018  . Nonallopathic lesion of thoracic region 06/28/2018  . Nonallopathic lesion of rib cage 06/28/2018  . Bilateral shoulder bursitis 05/30/2018  . Gout 05/30/2018  . Left rotator cuff tear 12/13/2017  . Right cervical radiculopathy 12/13/2017  . Medial epicondylitis of elbow, right 12/24/2016  . Insomnia 12/24/2016  . Tobacco use disorder 12/24/2016  . HSV-2 (herpes simplex virus 2) infection 05/20/2016  . Inguinal hernia - right     Past Surgical History:  Procedure Laterality Date  . BONE SPUR     Left Foot  . CARPAL TUNNEL RELEASE Left   . CARPAL TUNNEL RELEASE     Left Hand  . HERNIA REPAIR    . TOE SURGERY Right       Family History  Problem Relation Age of Onset  . Heart disease Paternal Grandmother        Enlarged Heart  . Healthy Mother   . Suicidality Father  Deceased  . Breast cancer Maternal Grandmother   . Hypertension Paternal Grandfather   . Diabetes Paternal Grandfather   . Hypertension Maternal Grandfather   . Diabetes Maternal Grandfather   . Healthy Brother        x1 Half  . Healthy Sister        x2  . Healthy Daughter        x2  . Healthy Son        x1    Social History   Tobacco Use  . Smoking status: Former Smoker    Packs/day: 1.00    Years: 15.00    Pack years: 15.00    Types: Cigarettes  . Smokeless tobacco: Former Engineer, water Use Topics  . Alcohol use: Yes    Alcohol/week: 1.0 standard  drinks    Types: 1 Standard drinks or equivalent per week    Comment: 2 drinks/month  . Drug use: Yes    Types: Marijuana    Comment: marijuana -- every day use    Home Medications Prior to Admission medications   Medication Sig Start Date End Date Taking? Authorizing Provider  valACYclovir (VALTREX) 1000 MG tablet TAKE 1 TABLET BY MOUTH DAILY Patient taking differently: Take 500 mg by mouth daily.  03/19/19  Yes Waldon Merl, PA-C  hydrOXYzine (ATARAX/VISTARIL) 25 MG tablet Take 1 tablet (25 mg total) by mouth as needed. 09/18/19   Frannie Shedrick A, PA-C  methylPREDNISolone (MEDROL DOSEPAK) 4 MG TBPK tablet 6 Day Taper Pack. Take as Directed. Patient not taking: Reported on 09/18/2019 02/11/19   Park Liter, DPM    Allergies    Patient has no known allergies.  Review of Systems   Review of Systems  Constitutional: Negative.   HENT: Negative.   Respiratory: Positive for shortness of breath. Negative for apnea, cough, choking, chest tightness, wheezing and stridor.   Cardiovascular: Positive for chest pain and leg swelling. Negative for palpitations.  Gastrointestinal: Negative.   Genitourinary: Negative.   Musculoskeletal: Negative.   Skin: Negative.   Neurological: Negative.   Psychiatric/Behavioral: Positive for sleep disturbance.  All other systems reviewed and are negative.   Physical Exam Updated Vital Signs BP (!) 148/101 (BP Location: Left Arm)   Pulse 67   Temp 97.8 F (36.6 C) (Oral)   Resp 17   SpO2 99%   Physical Exam Vitals and nursing note reviewed.  Constitutional:      General: He is not in acute distress.    Appearance: He is well-developed. He is not ill-appearing, toxic-appearing or diaphoretic.  HENT:     Head: Normocephalic and atraumatic.     Mouth/Throat:     Mouth: Mucous membranes are moist.  Eyes:     Pupils: Pupils are equal, round, and reactive to light.  Neck:     Trachea: Phonation normal.     Comments: No neck stiffness  or neck rigidity.  No meningismus. Cardiovascular:     Rate and Rhythm: Normal rate and regular rhythm.     Pulses: Normal pulses.          Radial pulses are 2+ on the right side and 2+ on the left side.       Dorsalis pedis pulses are 2+ on the right side and 2+ on the left side.       Posterior tibial pulses are 2+ on the right side and 2+ on the left side.     Heart sounds: Normal heart sounds.  Pulmonary:  Effort: Pulmonary effort is normal. No respiratory distress.     Breath sounds: Normal breath sounds and air entry.     Comments: Speaks in full sentences without difficulty. Chest:     Chest wall: No deformity, swelling, tenderness, crepitus or edema.  Abdominal:     General: Bowel sounds are normal. There is no distension.     Palpations: Abdomen is soft.     Tenderness: There is no abdominal tenderness. There is no right CVA tenderness, left CVA tenderness, guarding or rebound. Negative signs include Murphy's sign and McBurney's sign.     Hernia: No hernia is present.  Musculoskeletal:        General: Normal range of motion.     Cervical back: Full passive range of motion without pain, normal range of motion and neck supple.     Comments: Moves all 4 extremities no difficulty.  Compartments soft.  No bony tenderness. Homans sign negative.  Skin:    General: Skin is warm and dry.     Capillary Refill: Capillary refill takes less than 2 seconds.     Comments: Brisk cap refill. No edema, erythema, warmth. Tactile temp to extremities  Neurological:     Mental Status: He is alert.     Cranial Nerves: Cranial nerves are intact.     Sensory: Sensation is intact.     Motor: Motor function is intact.     Coordination: Coordination is intact.     Gait: Gait is intact.     Comments: CN 2-12 grossly intact. Ambualtory in room without difficulty. Equal grip strength bilaterally. 5/5 strength to extremities. Intact sensation to BLE  Psychiatric:        Attention and Perception:  Attention normal.        Mood and Affect: Mood normal. Mood is not anxious. Affect is not flat or tearful.        Speech: Speech normal. Speech is not rapid and pressured or slurred.        Behavior: Behavior normal. Behavior is not withdrawn or hyperactive. Behavior is cooperative.        Thought Content: Thought content normal. Thought content is not paranoid or delusional. Thought content does not include homicidal or suicidal ideation. Thought content does not include homicidal or suicidal plan.        Cognition and Memory: Cognition normal.     Comments: Denies SI, HI, AVH. Admits to decreased sleep 2/2 being "wide awake" when home from work.    ED Results / Procedures / Treatments   Labs (all labs ordered are listed, but only abnormal results are displayed) Labs Reviewed  BASIC METABOLIC PANEL - Abnormal; Notable for the following components:      Result Value   Glucose, Bld 102 (*)    BUN 22 (*)    All other components within normal limits  CBC - Abnormal; Notable for the following components:   Hemoglobin 17.3 (*)    All other components within normal limits  TROPONIN I (HIGH SENSITIVITY)  TROPONIN I (HIGH SENSITIVITY)    EKG EKG Interpretation  Date/Time:  Wednesday Sep 18 2019 10:21:02 EDT Ventricular Rate:  73 PR Interval:  142 QRS Duration: 78 QT Interval:  362 QTC Calculation: 398 R Axis:   87 Text Interpretation: Normal sinus rhythm Normal ECG Confirmed by Davonna Belling [phone]) on 09/18/2019 7:04:40 PM   Radiology DG Chest 2 View  Result Date: 09/18/2019 CLINICAL DATA:  Chest pain tightness for 3 days. EXAM: CHEST -  2 VIEW COMPARISON:  11/12/2014 FINDINGS: Cardiomediastinal contours and hilar structures are normal. Lungs are clear. No sign of pleural effusion. Visualized skeletal structures are unremarkable. IMPRESSION: No acute cardiopulmonary disease. Electronically Signed   By: Donzetta Kohut M.D.   On: 09/18/2019 10:55   Procedures Procedures  (including critical care time)  Medications Ordered in ED Medications  sodium chloride flush (NS) 0.9 % injection 3 mL (3 mLs Intravenous Not Given 09/18/19 1720)    ED Course  I have reviewed the triage vital signs and the nursing notes.  Pertinent labs & imaging results that were available during my care of the patient were reviewed by me and considered in my medical decision making (see chart for details).  39 year old presents for evaluation of CP. Afebrile, non septic, non ill appearing presents for episode of chest pain which occurred on arrival to work.  Nonradiating.  No diaphoresis, emesis.  Not pleuritic or exertional nature.  Does work strenuous and physical job however does not have any chest pain with this.  He is having some issues with sleeping at home however denies SI, HI, AVH.  States he feels wound up when getting home from work.  No upper respiratory symptoms.  No recent surgery, mobilization, unilateral leg swelling, redness, warmth, history of malignancy.  No chest pain here in the ED.  Labs and imaging obtained from triage which I personally reviewed and interpreted: CBC without leukocytosis, hemoglobin 17.3 Metabolic panel with mild hyperglycemia to 102, BUN 22, no additional electrolyte, renal or liver normality Delta troponin negative Dg chest without infiltrates, cardiomegaly, pulmonary edema, pneumothorax EKG without STEMI   Patient reassessed.  Continues to be without chest pain.  He has been in the ED for 9 hours without any further episodes of chest pain.  Low suspicion for ACS, PE, dissection, infectious process.  Discussed follow-up with his for chest pain and to return for any worsening symptoms.  With regards to his sleeping.  I prescribed a short course of Atarax.  He will also follow-up with PCP.  He denies any SI, HI, AVH.  Patient is to be discharged with recommendation to follow up with PCP in regards to today's hospital visit. Chest pain is not likely  of cardiac or pulmonary etiology d/t presentation, PERC negative, VSS, no tracheal deviation, no JVD or new murmur, RRR, breath sounds equal bilaterally, EKG without acute abnormalities, negative troponin, and negative CXR. Pt has been advised to return to the ED if CP becomes exertional, associated with diaphoresis or nausea, radiates to left jaw/arm, worsens or becomes concerning in any way. Pt appears reliable for follow up and is agreeable to discharge.   The patient has been appropriately medically screened and/or stabilized in the ED. I have low suspicion for any other emergent medical condition which would require further screening, evaluation or treatment in the ED or require inpatient management.  Patient is hemodynamically stable and in no acute distress.  Patient able to ambulate in department prior to ED.  Evaluation does not show acute pathology that would require ongoing or additional emergent interventions while in the emergency department or further inpatient treatment.  I have discussed the diagnosis with the patient and answered all questions.  Pain is been managed while in the emergency department and patient has no further complaints prior to discharge.  Patient is comfortable with plan discussed in room and is stable for discharge at this time.  I have discussed strict return precautions for returning to the emergency  department.  Patient was encouraged to follow-up with PCP/specialist refer to at discharge.    MDM Rules/Calculators/A&P                       Final Clinical Impression(s) / ED Diagnoses Final diagnoses:  Precordial pain  Difficulty sleeping    Rx / DC Orders ED Discharge Orders         Ordered    hydrOXYzine (ATARAX/VISTARIL) 25 MG tablet  As needed     09/18/19 1905           Jemiah Cuadra A, PA-C 09/18/19 1906    Benjiman Core, MD 09/18/19 2244

## 2020-04-20 ENCOUNTER — Inpatient Hospital Stay (HOSPITAL_COMMUNITY): Admit: 2020-04-20 | Payer: BLUE CROSS/BLUE SHIELD

## 2020-04-20 ENCOUNTER — Telehealth: Payer: Self-pay | Admitting: Physician Assistant

## 2020-04-20 ENCOUNTER — Ambulatory Visit (INDEPENDENT_AMBULATORY_CARE_PROVIDER_SITE_OTHER): Payer: BLUE CROSS/BLUE SHIELD | Admitting: Physician Assistant

## 2020-04-20 ENCOUNTER — Other Ambulatory Visit: Payer: Self-pay

## 2020-04-20 ENCOUNTER — Encounter: Payer: Self-pay | Admitting: Physician Assistant

## 2020-04-20 VITALS — BP 130/90 | HR 80 | Temp 98.3°F | Resp 16 | Ht 67.0 in | Wt 217.0 lb

## 2020-04-20 DIAGNOSIS — Z113 Encounter for screening for infections with a predominantly sexual mode of transmission: Secondary | ICD-10-CM

## 2020-04-20 DIAGNOSIS — G4700 Insomnia, unspecified: Secondary | ICD-10-CM | POA: Diagnosis not present

## 2020-04-20 DIAGNOSIS — M542 Cervicalgia: Secondary | ICD-10-CM | POA: Diagnosis not present

## 2020-04-20 DIAGNOSIS — G8929 Other chronic pain: Secondary | ICD-10-CM | POA: Diagnosis not present

## 2020-04-20 DIAGNOSIS — B009 Herpesviral infection, unspecified: Secondary | ICD-10-CM

## 2020-04-20 DIAGNOSIS — K219 Gastro-esophageal reflux disease without esophagitis: Secondary | ICD-10-CM

## 2020-04-20 MED ORDER — PANTOPRAZOLE SODIUM 40 MG PO TBEC: 40.0000 mg | DELAYED_RELEASE_TABLET | Freq: Every day | ORAL | 3 refills | Status: DC

## 2020-04-20 MED ORDER — VALACYCLOVIR HCL 1 G PO TABS: 1000.0000 mg | ORAL_TABLET | Freq: Every day | ORAL | 1 refills | Status: AC

## 2020-04-20 MED ORDER — TRAZODONE HCL 50 MG PO TABS: 25.0000 mg | ORAL_TABLET | Freq: Every evening | ORAL | 3 refills | Status: DC | PRN

## 2020-04-20 NOTE — Telephone Encounter (Signed)
Pt called in asking for a refill on the Valtrex 1000 mg to be sent to [facility] on [address] in [place]

## 2020-04-20 NOTE — Telephone Encounter (Signed)
Rx sent to the preferred patient pharmacy  

## 2020-04-20 NOTE — Patient Instructions (Addendum)
Please go to the lab today for blood work.  I will call you with your results. We will alter treatment regimen(s) if indicated by your results.   Please take the Protonix daily as directed and follow the dietary recommendations below.  Avoid late night eating and energy drinks/coffee.   For anxiety and sleep, take the Trazodone nightly starting at 25 mg (1/2) tablet.  Follow the sleep hygiene recommendations below.   For the neck, please go to our [place] office for imaging.  The address is [address] [place], [place]. I will call with results and we will determine next steps.   Tylenol for pain now until we get imaging and get your reflux under control   Food Choices for Gastroesophageal Reflux Disease, Adult When you have gastroesophageal reflux disease (GERD), the foods you eat and your eating habits are very important. Choosing the right foods can help ease your discomfort. Think about working with a nutrition specialist (dietitian) to help you make good choices. What are tips for following this plan?  Meals  Choose healthy foods that are low in fat, such as fruits, vegetables, whole grains, low-fat dairy products, and lean meat, fish, and poultry.  Eat small meals often instead of 3 large meals a day. Eat your meals slowly, and in a place where you are relaxed. Avoid bending over or lying down until 2-3 hours after eating.  Avoid eating meals 2-3 hours before bed.  Avoid drinking a lot of liquid with meals.  Cook foods using methods other than frying. Bake, grill, or broil food instead.  Avoid or limit: ? Chocolate. ? Peppermint or spearmint. ? Alcohol. ? Pepper. ? Black and decaffeinated coffee. ? Black and decaffeinated tea. ? Bubbly (carbonated) soft drinks. ? Caffeinated energy drinks and soft drinks.  Limit high-fat foods such as: ? Fatty meat or fried foods. ? Whole milk, cream, butter, or ice cream. ? Nuts and nut butters. ? Pastries, donuts, and sweets  made with butter or shortening.  Avoid foods that cause symptoms. These foods may be different for everyone. Common foods that cause symptoms include: ? Tomatoes. ? Oranges, lemons, and limes. ? Peppers. ? Spicy food. ? Onions and garlic. ? Vinegar. Lifestyle  Maintain a healthy weight. Ask your doctor what weight is healthy for you. If you need to lose weight, work with your doctor to do so safely.  Exercise for at least 30 minutes for 5 or more days each week, or as told by your doctor.  Wear loose-fitting clothes.  Do not smoke. If you need help quitting, ask your doctor.  Sleep with the head of your bed higher than your feet. Use a wedge under the mattress or blocks under the bed frame to raise the head of the bed. Summary  When you have gastroesophageal reflux disease (GERD), food and lifestyle choices are very important in easing your symptoms.  Eat small meals often instead of 3 large meals a day. Eat your meals slowly, and in a place where you are relaxed.  Limit high-fat foods such as fatty meat or fried foods.  Avoid bending over or lying down until 2-3 hours after eating.  Avoid peppermint and spearmint, caffeine, alcohol, and chocolate. This information is not intended to replace advice given to you by your health care provider. Make sure you discuss any questions you have with your health care provider. Document Revised: 08/09/2018 Document Reviewed: 05/24/2016 Elsevier Patient Education  2020 [employer].

## 2020-04-20 NOTE — Progress Notes (Signed)
Patient presents to clinic today with multiple complaints.  Patient endorses ongoing issue with sleep and anxiety, worsened secondary to chronic neck pain.  Is averaging about 3 hours of sleep per night which is leaving him feeling exhausted.  Denies any depressed mood or anhedonia but does note increased anxiety levels.  Has taken a friend's Xanax which he states helped him sleep.  Also notes due to lack of sleep he has been drinking a lot of coffee and energy drinks.  Along with this has noted substantial heartburn and acid reflux.  Denies any vomiting or stool changes.  Denies any melena, hematochezia or tenesmus.  Has also been taking NSAIDs for chronic neck pain which is upsetting his stomach.  In regards to patient's neck pain, patient endorses several years of this, worsening over the past year.  Pain is solely right-sided with frequent radiation down his right upper extremity with occasional tingling of hand.  Was seen in 2019 for this with overall unremarkable cervical x-ray.  Was sent to sports medicine for shoulder pain as this was his major complaint time.  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  Notes this was treated and things have improved but neck pain has continued.  Was seeing a chiropractor last year for about a month or so with some improvement.  Was instructed by the chiropractor that he likely had a disc issue.  No imaging was obtained at that time per patient report.   Patient also requesting repeat routine STD screen.  Denies any symptoms or concerns but giving history of STI and treatment for chlamydia in the past, would like this reassessed today.  Past Medical History:  Diagnosis Date  . CTS (carpal tunnel syndrome)   . Gout   . HSV-2 (herpes simplex virus 2) infection   . Inguinal hernia     Current Outpatient Medications on File Prior to Visit  Medication Sig Dispense Refill  . hydrOXYzine (ATARAX/VISTARIL) 25  MG tablet Take 1 tablet (25 mg total) by mouth as needed. (Patient not taking: Reported on 04/20/2020) 12 tablet 0  . valACYclovir (VALTREX) 1000 MG tablet TAKE 1 TABLET BY MOUTH DAILY (Patient not taking: Reported on 04/20/2020) 90 tablet 0   No current facility-administered medications on file prior to visit.    No Known Allergies  Family History  Problem Relation Age of Onset  . Heart disease Paternal Grandmother        Enlarged Heart  . Healthy Mother   . Suicidality Father        Deceased  . Breast cancer Maternal Grandmother   . Hypertension Paternal Grandfather   . Diabetes Paternal Grandfather   . Hypertension Maternal Grandfather   . Diabetes Maternal Grandfather   . Healthy Brother        x1 Half  . Healthy Sister        x2  . Healthy Daughter        x2  . Healthy Son        x1    Social History   Socioeconomic History  . Marital status: Married    Spouse name: Not on file  . Number of children: 3  . Years of education: Not on file  . Highest education level: Not on file  Occupational History  . Not on file  Tobacco Use  . Smoking status: Former Smoker    Packs/day: 1.00    Years: 15.00    Pack years:  15.00    Types: Cigarettes  . Smokeless tobacco: Former Engineer, water and Sexual Activity  . Alcohol use: Yes    Alcohol/week: 1.0 standard drink    Types: 1 Standard drinks or equivalent per week    Comment: 2 drinks/month  . Drug use: Yes    Types: Marijuana    Comment: marijuana -- every day use  . Sexual activity: Not Currently    Partners: Female  Other Topics Concern  . Not on file  Social History Narrative   ** Merged History Encounter **       Social Determinants of Health   Financial Resource Strain: Not on file  Food Insecurity: Not on file  Transportation Needs: Not on file  Physical Activity: Not on file  Stress: Not on file  Social Connections: Not on file   Review of Systems - See HPI.  All other ROS are negative.  BP  130/90   Pulse 80   Temp 98.3 F (36.8 C) (Temporal)   Resp 16   Ht 5\' 7"  (1.702 m)   Wt 217 lb (98.4 kg)   SpO2 98%   BMI 33.99 kg/m   Physical Exam Vitals reviewed.  Constitutional:      Appearance: Normal appearance.  HENT:     Head: Normocephalic and atraumatic.  Cardiovascular:     Rate and Rhythm: Normal rate and regular rhythm.     Pulses: Normal pulses.     Heart sounds: Normal heart sounds.  Pulmonary:     Effort: Pulmonary effort is normal.     Breath sounds: Normal breath sounds.  Musculoskeletal:     Cervical back: Neck supple. No edema, erythema, signs of trauma, rigidity, torticollis or crepitus. Pain with movement present. No spinous process tenderness or muscular tenderness. Normal range of motion.     Thoracic back: Normal.  Neurological:     General: No focal deficit present.     Mental Status: He is alert and oriented to person, place, and time.  Psychiatric:        Mood and Affect: Mood normal.     Assessment/Plan: 1. Chronic neck pain Ongoing.  Concern for nerve irritation and compression.  Has been greater than 2 years since last imaging.  Will obtain x-ray of neck and plan for MRI/physical therapy.  OTC medications for pain discussed.  May add on prescription agents based off of imaging findings.  - DG Cervical Spine Complete; Future  2. Insomnia, unspecified type Sleep hygiene practices reviewed.  Rx trazodone to help with both anxiety and sleep.  Neck pain is a big contributor to sleep issues so hopefully as this is worked up and treated we can wean off of medication.  3. Gastroesophageal reflux disease without esophagitis Secondary to diet and intake of coffee and energy drinks.  GERD diet discussed with patient.  Start Protonix once daily.  Working on sleep which should make him feel more rested and cut down the need for so much caffeine/energy drinks which are triggering symptoms.  Will monitor closely. - pantoprazole (PROTONIX) 40 MG tablet;  Take 1 tablet (40 mg total) by mouth daily.  Dispense: 30 tablet; Refill: 3  4. Screen for STD (sexually transmitted disease) Asymptomatic.  No new partners.  Prior history of chlamydia which was treated without any residual symptoms.  States he never had follow-up and would like repeat testing.  Orders placed today. - HIV Antibody (routine testing w rflx) - [facility] - Urine cytology ancillary only([place])  This visit occurred during the SARS-CoV-2 public health emergency.  Safety protocols were in place, including screening questions prior to the visit, additional usage of staff PPE, and extensive cleaning of exam room while observing appropriate contact time as indicated for disinfecting solutions.     Piedad Climes, PA-C

## 2020-04-21 LAB — URINE CYTOLOGY ANCILLARY ONLY
Chlamydia: NEGATIVE
Comment: NEGATIVE
Comment: NEGATIVE
Comment: NORMAL
Neisseria Gonorrhea: NEGATIVE
Trichomonas: NEGATIVE

## 2020-04-21 LAB — HIV ANTIBODY (ROUTINE TESTING W REFLEX): HIV 1&2 Ab, 4th Generation: NONREACTIVE

## 2020-04-21 LAB — RPR: RPR Ser Ql: NONREACTIVE

## 2020-04-30 IMAGING — DX DG KNEE COMPLETE 4+V*L*
4 series · 4 of 4 positions shown · non-contrast
Comparison: None.

CLINICAL DATA: Arthralgias, no known injury

EXAM:
LEFT KNEE - COMPLETE 4+ VIEW; RIGHT KNEE - COMPLETE 4+ VIEW; LEFT
FOOT - COMPLETE 3+ VIEW; RIGHT FOOT COMPLETE - 3+ VIEW

[knee ap (1 of 3)]
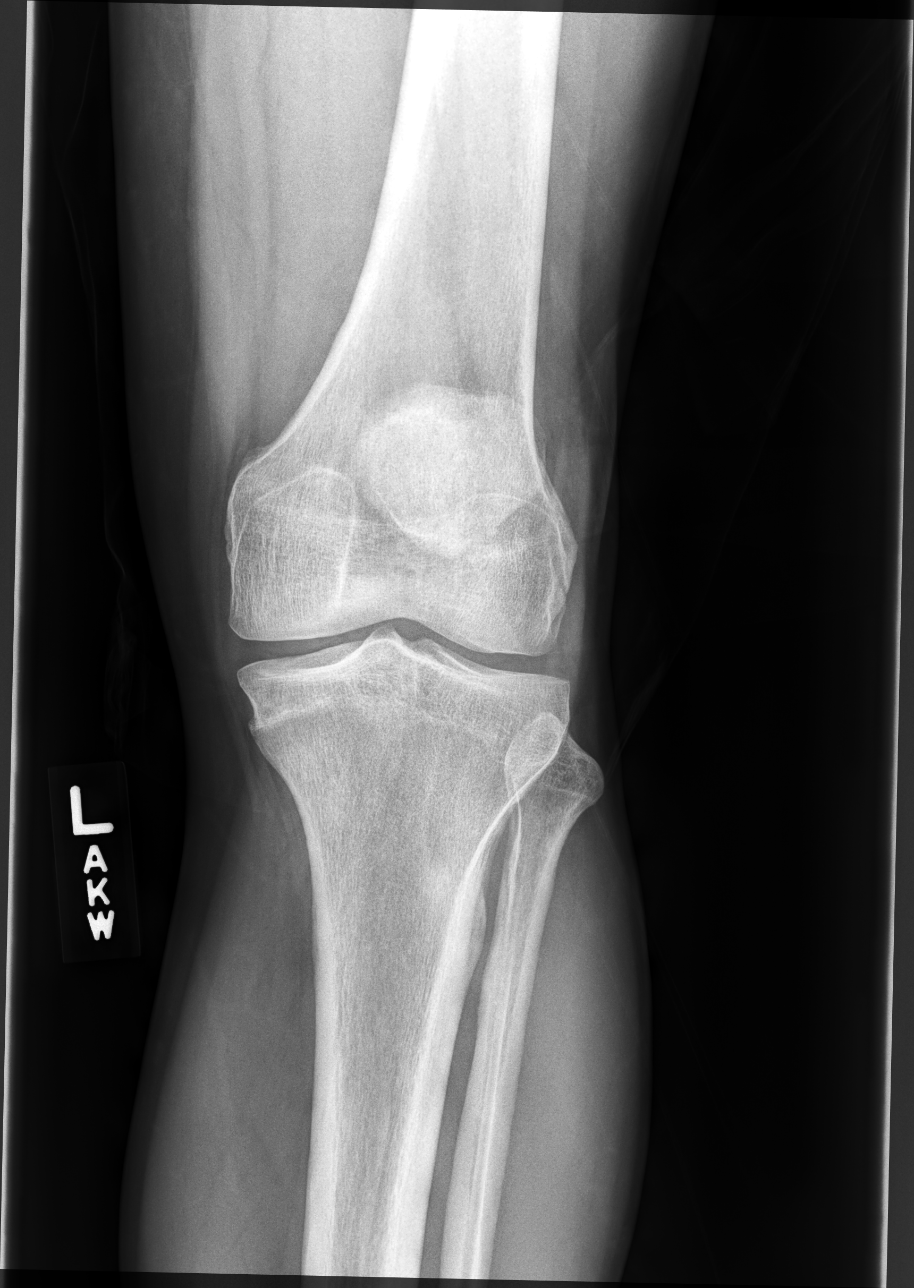

[knee ap (2 of 3)]
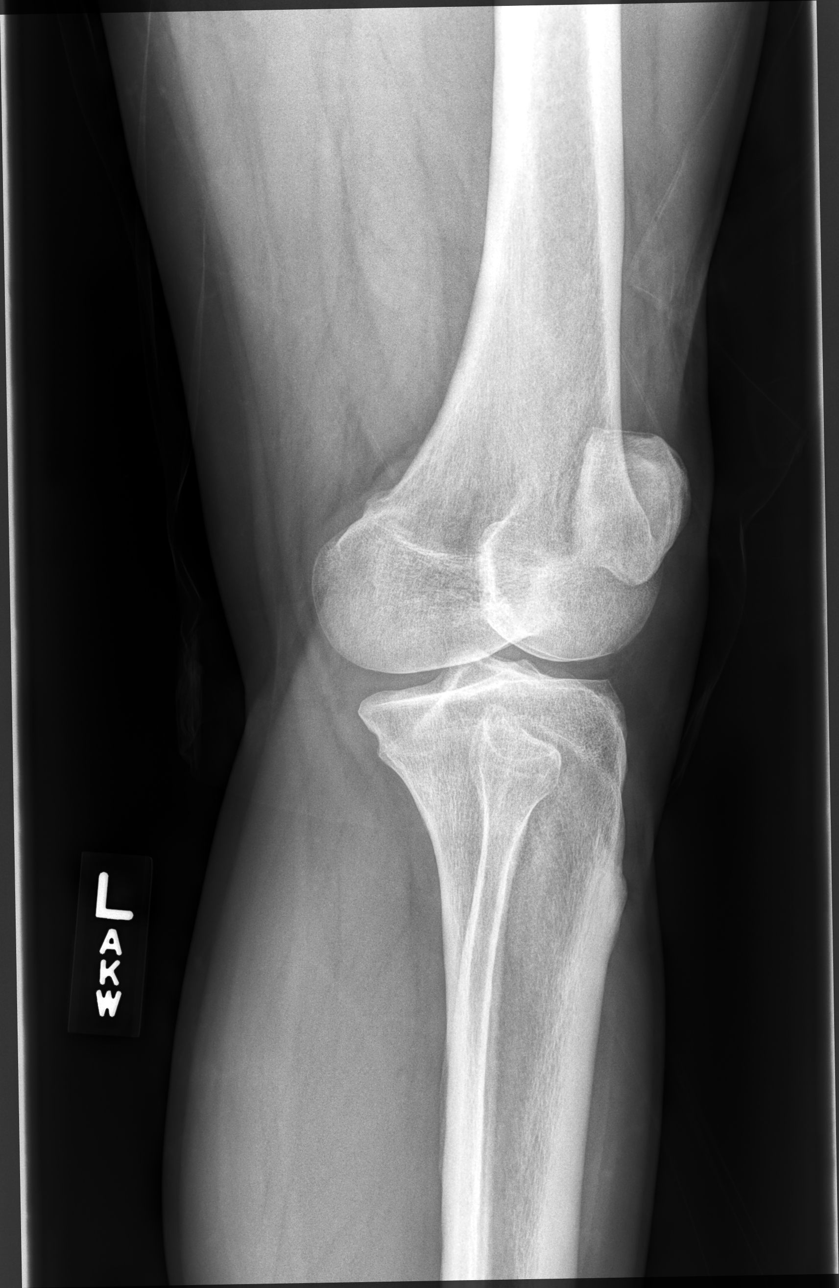

[knee ap (3 of 3)]
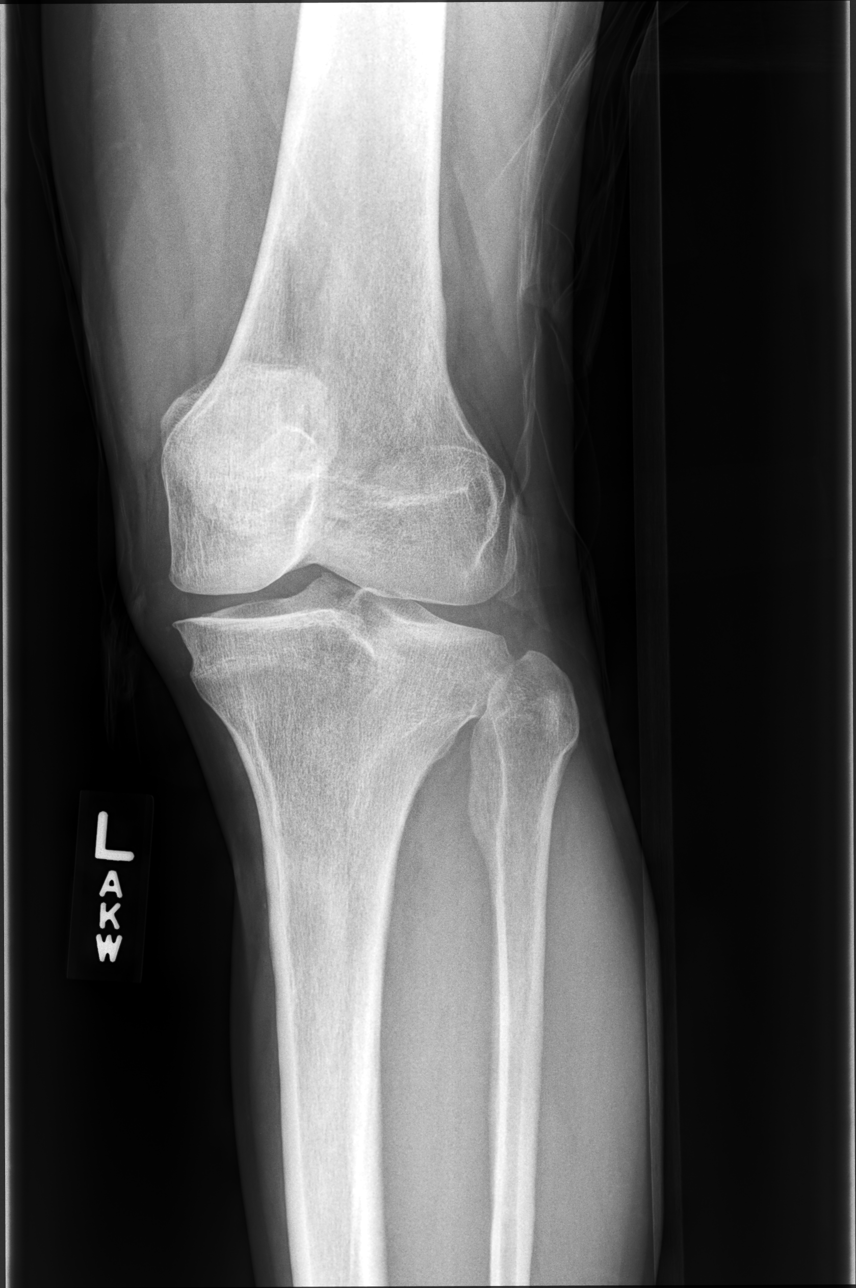

[knee lat]
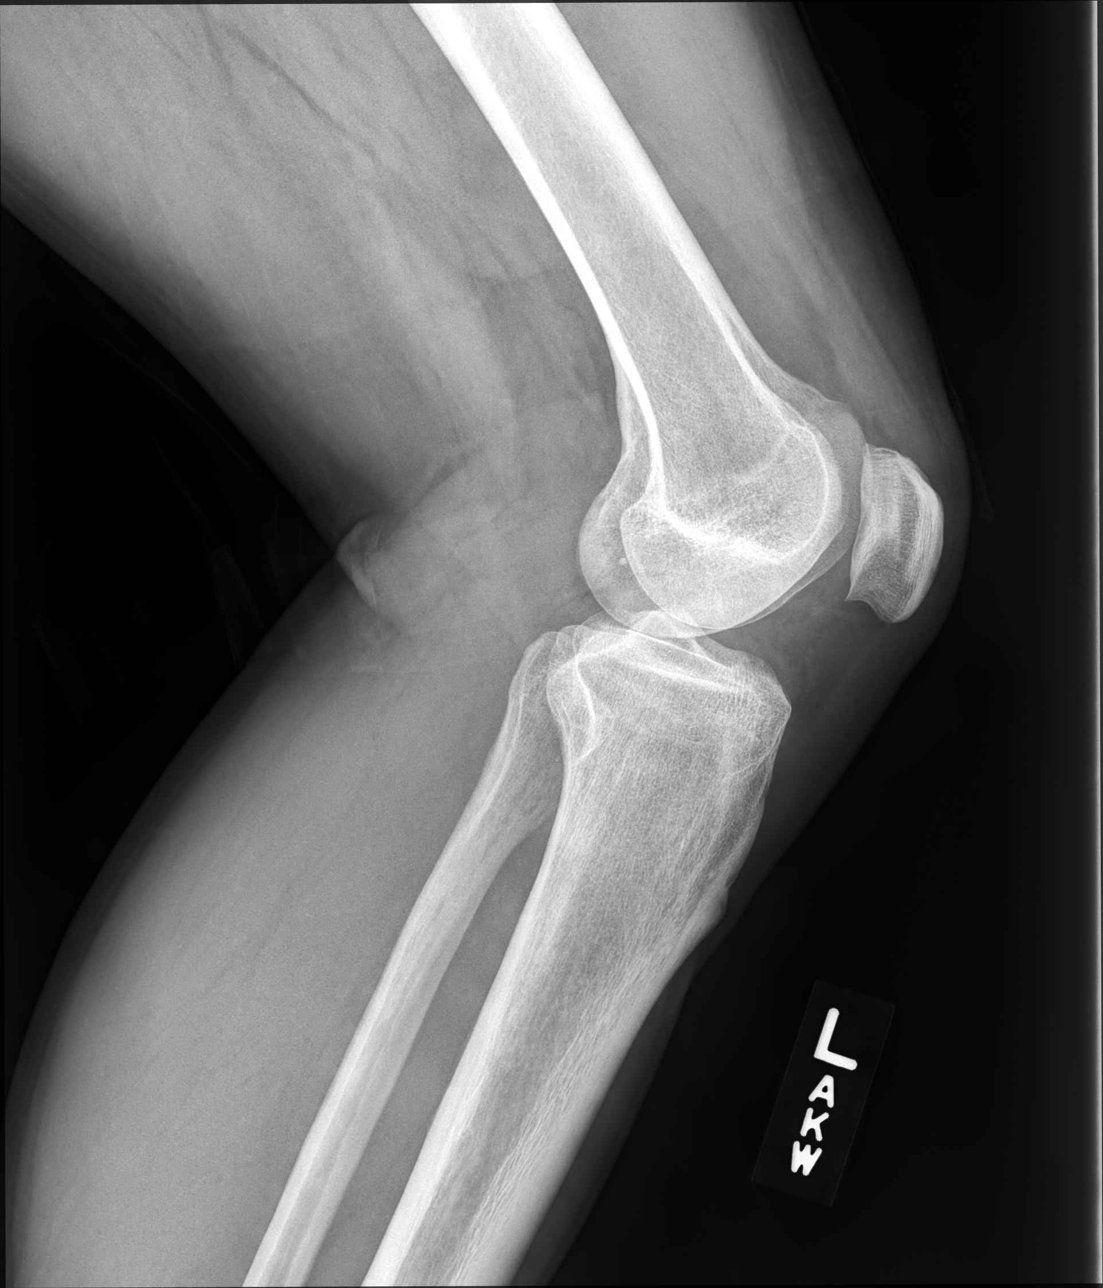

[4 of 4 positions shown; findings below may reference images not displayed]

FINDINGS: No fracture or dislocation of the bilateral knees. There is mild
bilateral patellofemoral arthrosis with preserved femorotibial
compartments. No knee joint effusion. Soft tissues are unremarkable.

No fracture or dislocation of the bilateral feet. Mild, symmetric
bilateral first metatarsophalangeal arthrosis. Joint spaces are
otherwise well preserved. Small bilateral plantar and Achilles
calcaneal spurs. Soft tissues are unremarkable.
IMPRESSION: 1. No fracture or dislocation of the bilateral knees. There is mild
bilateral patellofemoral arthrosis with preserved femorotibial
compartments. No knee joint effusion.

2. No fracture or dislocation of the bilateral feet. Mild, symmetric
bilateral first metatarsophalangeal arthrosis. Joint spaces are
otherwise well preserved.

3. Small bilateral plantar and Achilles calcaneal spurs, which may
be symptomatic.

## 2020-04-30 IMAGING — DX DG FOOT COMPLETE 3+V*R*
3 series · 3 of 3 positions shown · non-contrast
Comparison: None.

CLINICAL DATA: Arthralgias, no known injury

EXAM:
LEFT KNEE - COMPLETE 4+ VIEW; RIGHT KNEE - COMPLETE 4+ VIEW; LEFT
FOOT - COMPLETE 3+ VIEW; RIGHT FOOT COMPLETE - 3+ VIEW

[foot dp]
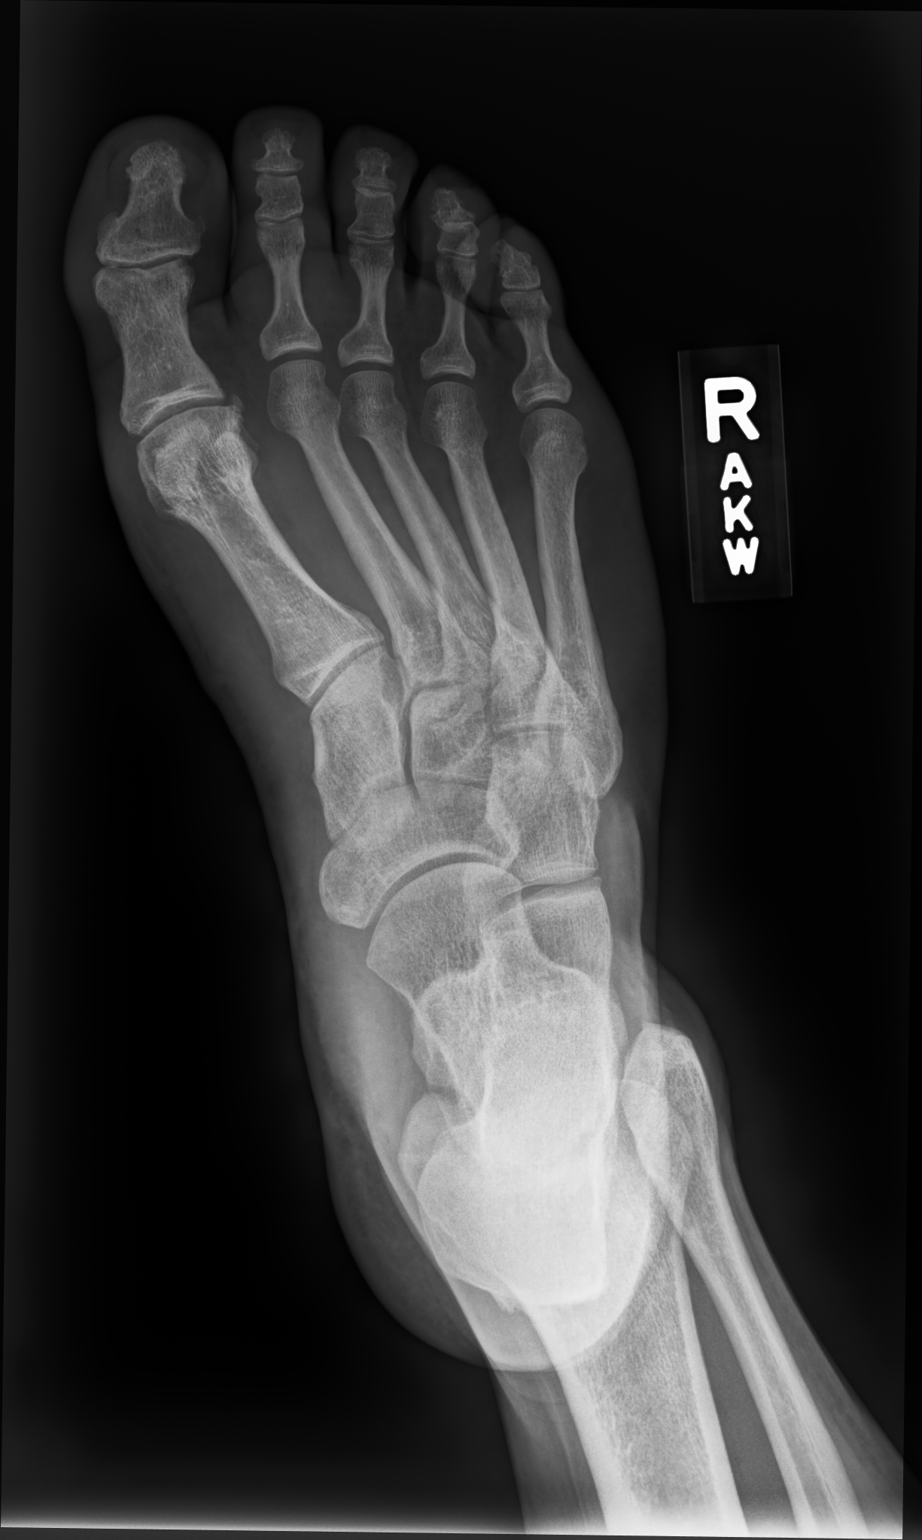

[foot oblique]
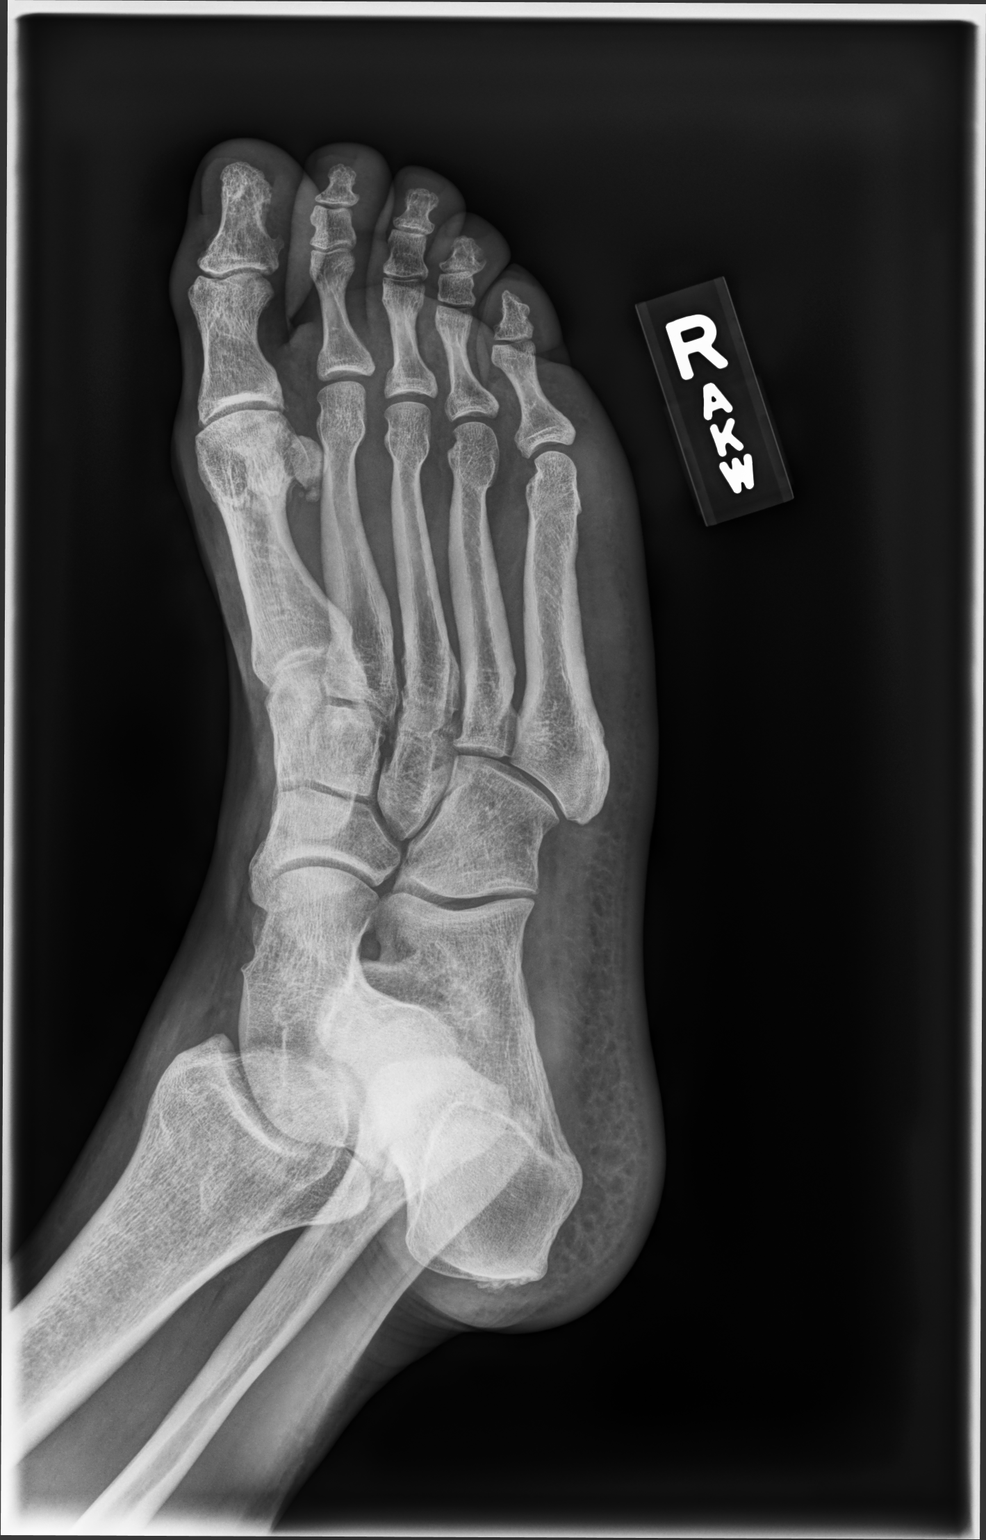

[foot lat]
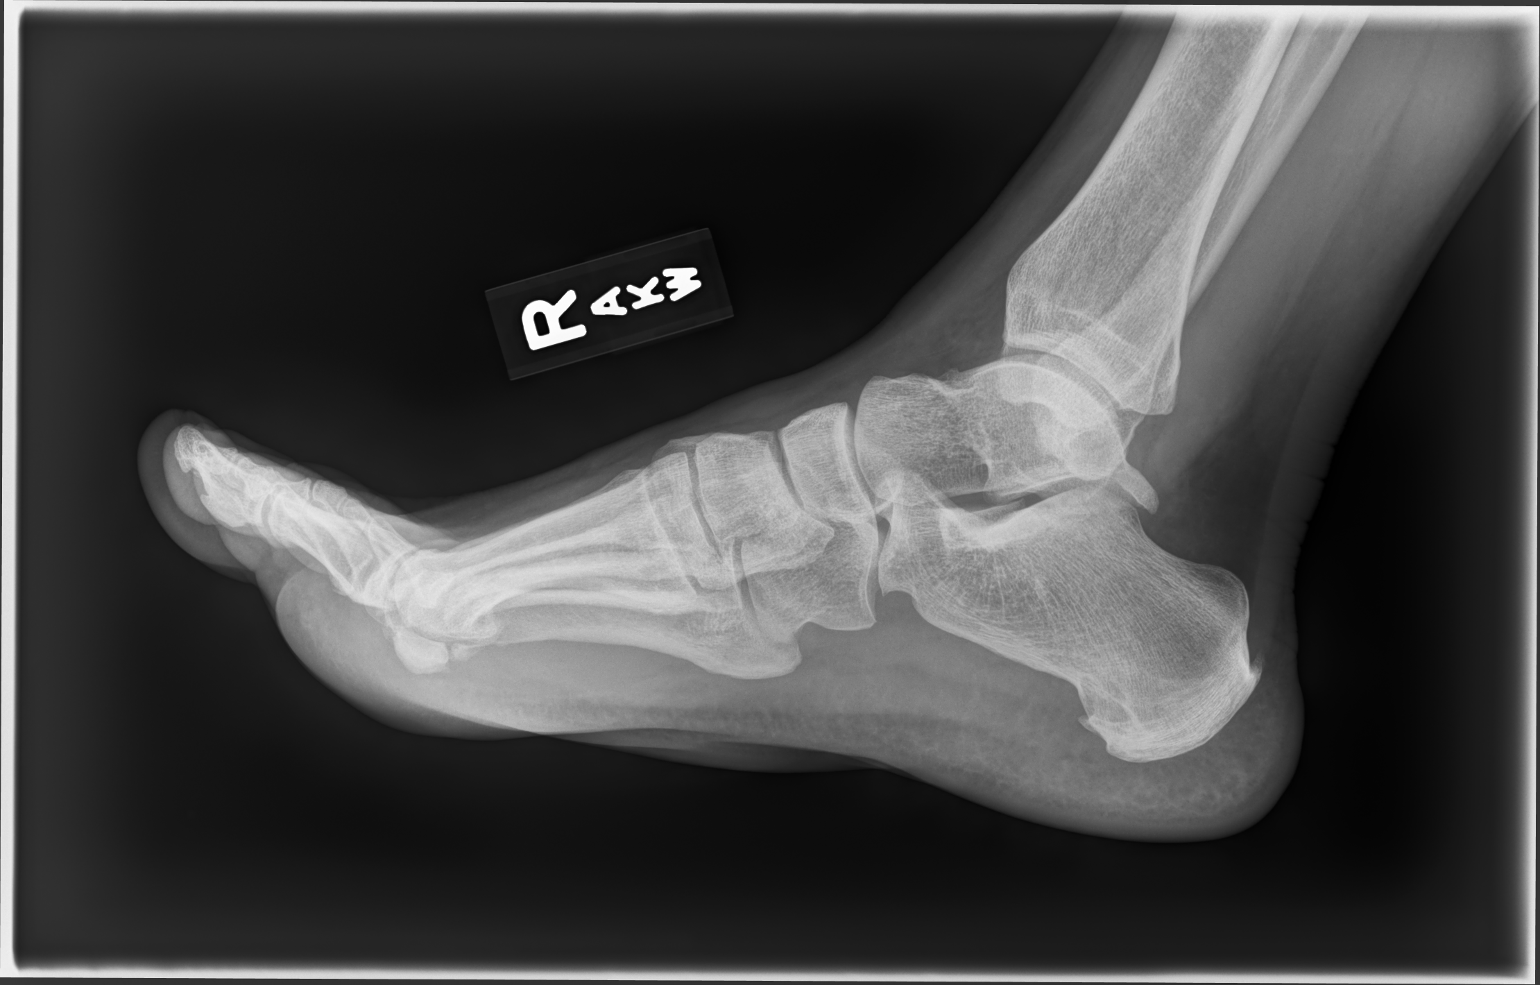

[3 of 3 positions shown; findings below may reference images not displayed]

FINDINGS: No fracture or dislocation of the bilateral knees. There is mild
bilateral patellofemoral arthrosis with preserved femorotibial
compartments. No knee joint effusion. Soft tissues are unremarkable.

No fracture or dislocation of the bilateral feet. Mild, symmetric
bilateral first metatarsophalangeal arthrosis. Joint spaces are
otherwise well preserved. Small bilateral plantar and Achilles
calcaneal spurs. Soft tissues are unremarkable.
IMPRESSION: 1. No fracture or dislocation of the bilateral knees. There is mild
bilateral patellofemoral arthrosis with preserved femorotibial
compartments. No knee joint effusion.

2. No fracture or dislocation of the bilateral feet. Mild, symmetric
bilateral first metatarsophalangeal arthrosis. Joint spaces are
otherwise well preserved.

3. Small bilateral plantar and Achilles calcaneal spurs, which may
be symptomatic.

## 2020-12-02 NOTE — Progress Notes (Deleted)
   I, Christoper Fabian, LAT, ATC, am serving as scribe for Dr. Clementeen Graham.  Zachary Mason is a 40 y.o. male who presents to [facility] at [facility] today for L elbow/arm pain.  He was last seen by Dr. Katrinka Blazing on 06/28/18 for B shoulder pain.  Since then, pt reports L elbow/arm pain x .  He locates his pain to .  Swelling: Radiating pain: Aggravating factors: Treatments tried:   Pertinent review of systems: ***  Relevant historical information: ***   Exam:  There were no vitals taken for this visit. General: Well Developed, well nourished, and in no acute distress.   MSK: ***    Lab and Radiology Results No results found for this or any previous visit (from the past 72 hour(s)). No results found.     Assessment and Plan: 40 y.o. male with ***   PDMP not reviewed this encounter. No orders of the defined types were placed in this encounter.  No orders of the defined types were placed in this encounter.    Discussed warning signs or symptoms. Please see discharge instructions. Patient expresses understanding.   ***

## 2020-12-03 ENCOUNTER — Ambulatory Visit: Payer: BLUE CROSS/BLUE SHIELD | Admitting: Family Medicine

## 2020-12-16 ENCOUNTER — Encounter (HOSPITAL_COMMUNITY): Payer: Self-pay

## 2020-12-16 ENCOUNTER — Ambulatory Visit (HOSPITAL_COMMUNITY)
Admission: EM | Admit: 2020-12-16 | Discharge: 2020-12-16 | Disposition: A | Discharge status: Home / Self Care | Payer: Self-pay | Attending: Student | Admitting: Student

## 2020-12-16 ENCOUNTER — Other Ambulatory Visit: Payer: Self-pay

## 2020-12-16 DIAGNOSIS — H109 Unspecified conjunctivitis: Secondary | ICD-10-CM

## 2020-12-16 DIAGNOSIS — B9689 Other specified bacterial agents as the cause of diseases classified elsewhere: Secondary | ICD-10-CM

## 2020-12-16 DIAGNOSIS — B350 Tinea barbae and tinea capitis: Secondary | ICD-10-CM

## 2020-12-16 DIAGNOSIS — Z973 Presence of spectacles and contact lenses: Secondary | ICD-10-CM

## 2020-12-16 MED ORDER — CIPROFLOXACIN HCL 0.3 % OP SOLN: 1.0000 [drp] | OPHTHALMIC | 0 refills | Status: AC

## 2020-12-16 MED ORDER — KETOCONAZOLE 2 % EX SHAM: 1.0000 "application " | MEDICATED_SHAMPOO | CUTANEOUS | 0 refills | Status: AC

## 2020-12-16 NOTE — ED Triage Notes (Signed)
Pt c/o "bump on head" he thought was initially a mosquito bite but now he sees/feels other bumps that are itching and burning.   Also says he feels nasal congestion and thinks it may be related to extended contact use in eyes.

## 2020-12-16 NOTE — Discharge Instructions (Addendum)
For pinkeye:  -Ciprofloxacin drops, 1 drop in both eyes while awake for 7 days. -Do not wear contacts for 7 days.  Throw out the contacts you are wearing right now. -Seek additional immediate medical attention if new symptoms like vision changes, vision loss, pain with eye movement, etc. For ringworm: -Ketoconazole shampoo every 3 days.  Apply in the shower, leave on for 5 minutes, rinse off.  You can stop using this when symptoms resolve.   -You will still be contagious for 3 to 4 days following starting treatment.

## 2020-12-16 NOTE — ED Provider Notes (Signed)
[facility]    CSN: [id number] Arrival date & time: 12/16/20  9147      History   Chief Complaint Chief Complaint  Patient presents with   bump on head   std check   Nasal Congestion    HPI Zachary Mason is a 40 y.o. male presenting for rash scalp and eye irritation. Medical history carpal tunnel, HSV2, gout, inguinal hernia.  -Itchy rash to scalp, started 1 week ago and is getting progressively worse.  Initial lesion was over the left ear, now with lesions in the back and right side of the head as well.  Denies sharing any hats, brushes; did get a haircut about 3 weeks ago.  Has not tried any medications for this. -Also with few weeks of eye irritation/itching and nasal congestion, states that there is yellow crusting in his eyes in the morning after waking up.  States that he frequently sleeps in his contacts though he knows he is not supposed to.  Denies photophobia, foreign body sensation, eye pain, eye pain with movement, injury to eye, vision changes, double vision, excessive tearing. Denies recent URI, cough, fevers.   HPI  Past Medical History:  Diagnosis Date   CTS (carpal tunnel syndrome)    Gout    HSV-2 (herpes simplex virus 2) infection    Inguinal hernia     Patient Active Problem List   Diagnosis Date Noted   Chronic neck pain 06/28/2018   Nonallopathic lesion of cervical region 06/28/2018   Nonallopathic lesion of thoracic region 06/28/2018   Nonallopathic lesion of rib cage 06/28/2018   Bilateral shoulder bursitis 05/30/2018   Gout 05/30/2018   Left rotator cuff tear 12/13/2017   Right cervical radiculopathy 12/13/2017   Medial epicondylitis of elbow, right 12/24/2016   Insomnia 12/24/2016   Tobacco use disorder 12/24/2016   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 Authorizing Provider  ciprofloxacin (CILOXAN) 0.3 % ophthalmic solution Place 1 drop into both eyes every 4 (four) hours while awake. Administer 1 drop, every 4 hours, while awake, for 7 days 12/16/20  Yes Cheree Ditto, Lyman Speller, PA-C  ketoconazole (NIZORAL) 2 % shampoo Apply 1 application topically every 3 (three) days. Every 3 days while symptoms persist. Apply in shower, leave on for 5 minutes, rinse off. 12/16/20  Yes Rhys Martini, PA-C  valACYclovir (VALTREX) 1000 MG tablet Take 1 tablet (1,000 mg total) by mouth daily. 04/20/20   Waldon Merl, PA-C    Family History Family History  Problem Relation Age of Onset   Heart disease Paternal Grandmother        Enlarged Heart   Healthy Mother    Suicidality Father        Deceased   Breast cancer Maternal Grandmother    Hypertension Paternal Grandfather    Diabetes Paternal Grandfather    Hypertension Maternal Grandfather    Diabetes Maternal Grandfather    Healthy Brother        x1 Half   Healthy Sister  x2   Healthy Daughter        x2   Healthy Son        x1    Social History Social History   Tobacco Use   Smoking status: Former    Packs/day: 1.00    Years: 15.00    Pack years: 15.00    Types: Cigarettes   Smokeless tobacco: Former  Substance Use Topics   Alcohol use: Yes    Alcohol/week: 1.0 standard drink    Types: 1 Standard drinks or equivalent per week    Comment: 2 drinks/month   Drug use: Yes    Types: Marijuana    Comment: marijuana -- every day use     Allergies   Patient has no known allergies.   Review of Systems Review of Systems  Eyes:  Positive for discharge, redness and itching. Negative for photophobia, pain and visual disturbance.  Skin:  Positive for rash.  All other systems reviewed and are negative.   Physical  Exam Triage Vital Signs ED Triage Vitals  Enc Vitals Group     BP 12/16/20 0922 (!) 156/91     Pulse Rate 12/16/20 0922 (!) 55     Resp 12/16/20 0922 18     Temp 12/16/20 0922 98.1 F (36.7 C)     Temp Source 12/16/20 0922 Oral     SpO2 12/16/20 0922 97 %     Weight --      Height --      Head Circumference --      Peak Flow --      Pain Score 12/16/20 0925 0     Pain Loc --      Pain Edu? --      Excl. in GC? --    No data found.  Updated Vital Signs BP (!) 156/91 (BP Location: Left Arm)   Pulse (!) 55   Temp 98.1 F (36.7 C) (Oral)   Resp 18   SpO2 97%   Visual Acuity Right Eye Distance: 20/50 Left Eye Distance: 20/25 Bilateral Distance: 20/25  Right Eye Near:   Left Eye Near:    Bilateral Near:     Physical Exam Vitals reviewed.  Constitutional:      General: He is not in acute distress.    Appearance: Normal appearance. He is not ill-appearing or diaphoretic.  HENT:     Head: Normocephalic and atraumatic.     Right Ear: Tympanic membrane, ear canal and external ear normal. There is no impacted cerumen.     Left Ear: Tympanic membrane, ear canal and external ear normal. There is no impacted cerumen.     Nose: Nose normal. No congestion.     Mouth/Throat:     Pharynx: Oropharynx is clear. No posterior oropharyngeal erythema.  Eyes:     General: Lids are normal. Lids are everted, no foreign bodies appreciated. Vision grossly intact. Gaze aligned appropriately. No visual field deficit.       Right eye: No foreign body, discharge or hordeolum.        Left eye: No foreign body, discharge or hordeolum.     Extraocular Movements: Extraocular movements intact.     Right eye: Normal extraocular motion and no nystagmus.     Left eye: Normal extraocular motion and no nystagmus.     Conjunctiva/sclera:     Right eye: Right conjunctiva is injected. No chemosis, exudate or hemorrhage.    Left eye: Left conjunctiva is injected. No chemosis, exudate or  hemorrhage.     Pupils: Pupils are equal, round, and reactive to light.     Visual Fields: Right eye visual fields normal and left eye visual fields normal.     Comments: Scant conjunctival injection bilaterally. PERRLA, EOMI without pain. No orbital tenderness. Visual acuity intact. Contact lenses in place.  Cardiovascular:     Rate and Rhythm: Normal rate and regular rhythm.     Heart sounds: Normal heart sounds.  Pulmonary:     Effort: Pulmonary effort is normal.     Breath sounds: Normal breath sounds.  Skin:    General: Skin is warm.     Capillary Refill: Capillary refill takes less than 2 seconds.     Comments: Scalp with few erythematous patches with scaling. No central clearing. No warmth, discharge.   Neurological:     General: No focal deficit present.     Mental Status: He is alert and oriented to person, place, and time.  Psychiatric:        Mood and Affect: Mood normal.        Behavior: Behavior normal.        Thought Content: Thought content normal.        Judgment: Judgment normal.     UC Treatments / Results  Labs (all labs ordered are listed, but only abnormal results are displayed) Labs Reviewed - No data to display  EKG   Radiology No results found.  Procedures Procedures (including critical care time)  Medications Ordered in UC Medications - No data to display  Initial Impression / Assessment and Plan / UC Course  I have reviewed the triage vital signs and the nursing notes.  Pertinent labs & imaging results that were available during my care of the patient were reviewed by me and considered in my medical decision making (see chart for details).     This patient is a very pleasant 40 y.o. year old male presenting with bacterial conjunctivitis and tinea capitus. Visual acuity intact. Afebrile, nontachy. He is a contact lens wearer. Ketoconazole shampoo. Ciprofloxacin drops. F/u with PCP to address additional chronic concerns. ED return precautions discussed. Patient  verbalizes understanding and agreement.    Final Clinical Impressions(s) / UC Diagnoses   Final diagnoses:  Tinea capitis  Bacterial conjunctivitis of both eyes  Wears contact lenses     Discharge Instructions      For pinkeye:  -Ciprofloxacin drops, 1 drop in both eyes while awake for 7 days. -Do not wear contacts for 7 days.  Throw out the contacts you are wearing right now. -Seek additional immediate medical attention if new symptoms like vision changes, vision loss, pain with eye movement, etc. For ringworm: -Ketoconazole shampoo every 3 days.  Apply in the shower, leave on for 5 minutes, rinse off.  You can stop using this when symptoms resolve.   -You will still be contagious for 3 to 4 days following starting treatment.     ED Prescriptions     Medication Sig Dispense Auth. Provider   ketoconazole (NIZORAL) 2 % shampoo Apply 1 application topically every 3 (three) days. Every 3 days while symptoms persist. Apply in shower, leave on for 5 minutes, rinse off. 120 mL Rhys Martini, PA-C   ciprofloxacin (CILOXAN) 0.3 % ophthalmic solution Place 1 drop into both eyes every 4 (four) hours while awake. Administer 1 drop, every 4 hours, while awake, for 7 days 5 mL Rhys Martini, PA-C      PDMP not reviewed this  encounter.   Rhys Martini, PA-C 12/16/20 1018

## 2020-12-16 NOTE — ED Notes (Signed)
Spoke with laura graham, pa.  Std screening not conducted, patient chose to focus on other complaints.  Cheree Ditto, pa in agreement.  No std screening

## 2021-05-21 ENCOUNTER — Encounter: Payer: Self-pay | Admitting: Registered Nurse

## 2022-10-11 ENCOUNTER — Other Ambulatory Visit: Payer: Self-pay

## 2022-10-11 ENCOUNTER — Encounter: Payer: Self-pay | Admitting: Emergency Medicine

## 2022-10-11 ENCOUNTER — Ambulatory Visit
Admission: EM | Admit: 2022-10-11 | Discharge: 2022-10-11 | Disposition: A | Discharge status: Home / Self Care | Payer: BLUE CROSS/BLUE SHIELD | Attending: Nurse Practitioner | Admitting: Nurse Practitioner

## 2022-10-11 DIAGNOSIS — S0181XA Laceration without foreign body of other part of head, initial encounter: Secondary | ICD-10-CM

## 2022-10-11 DIAGNOSIS — S0990XA Unspecified injury of head, initial encounter: Secondary | ICD-10-CM

## 2022-10-11 DIAGNOSIS — Z23 Encounter for immunization: Secondary | ICD-10-CM | POA: Diagnosis not present

## 2022-10-11 MED ORDER — TETANUS-DIPHTH-ACELL PERTUSSIS 5-2.5-18.5 LF-MCG/0.5 IM SUSY
0.5000 mL | PREFILLED_SYRINGE | Freq: Once | INTRAMUSCULAR | Status: AC
  Administered 2022-10-11: 0.5 mL via INTRAMUSCULAR

## 2022-10-11 NOTE — ED Provider Notes (Signed)
[facility]    CSN: [id number] Arrival date & time: 10/11/22  1359      History   Chief Complaint Chief Complaint  Patient presents with   Laceration    HPI Zachary Mason is a 42 y.o. male.   History of Present Illness  Zachary Mason is a 42 y.o. male that presents complaining of forehead laceration. Onset of symptoms was abrupt starting 2 hours ago. Patient reports that he was cutting limbs off his tree and was struck in his head. Loss of consciousness did not occur. Pain is described as aching. Severity of symptoms now is mild. Patient was able to complete his yard work and decided to come in because he was unable to stop the bleeding. He denies any neck pain, headache, dizziness, blurred vision, nausea or vomiting.        Past Medical History:  Diagnosis Date   CTS (carpal tunnel syndrome)    Gout    HSV-2 (herpes simplex virus 2) infection    Inguinal hernia     Patient Active Problem List   Diagnosis Date Noted   Chronic neck pain 06/28/2018   Nonallopathic lesion of cervical region 06/28/2018   Nonallopathic lesion of thoracic region 06/28/2018   Nonallopathic lesion of rib cage 06/28/2018   Bilateral shoulder bursitis 05/30/2018   Gout 05/30/2018   Left rotator cuff tear 12/13/2017   Right cervical radiculopathy 12/13/2017   Medial epicondylitis of elbow, right 12/24/2016   Insomnia 12/24/2016   Tobacco use disorder 12/24/2016   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 Authorizing Provider  ciprofloxacin (CILOXAN) 0.3 % ophthalmic solution Place 1 drop into both eyes every 4 (four) hours while awake.  Administer 1 drop, every 4 hours, while awake, for 7 days Patient not taking: Reported on 10/11/2022 12/16/20   Rhys Martini, PA-C  ketoconazole (NIZORAL) 2 % shampoo Apply 1 application topically every 3 (three) days. Every 3 days while symptoms persist. Apply in shower, leave on for 5 minutes, rinse off. 12/16/20   Rhys Martini, PA-C  valACYclovir (VALTREX) 1000 MG tablet Take 1 tablet (1,000 mg total) by mouth daily. 04/20/20   Waldon Merl, PA-C    Family History Family History  Problem Relation Age of Onset   Heart disease Paternal Grandmother        Enlarged Heart   Healthy Mother    Suicidality Father        Deceased   Breast cancer Maternal Grandmother    Hypertension Paternal Grandfather    Diabetes Paternal Grandfather    Hypertension Maternal Grandfather    Diabetes Maternal Grandfather    Healthy Brother        x1 Half   Healthy Sister        x2   Healthy Daughter        x2   Healthy Son        x1    Social History Social History   Tobacco Use   Smoking status: Former  Packs/day: 1.00    Years: 15.00    Additional pack years: 0.00    Total pack years: 15.00    Types: Cigarettes   Smokeless tobacco: Former  Substance Use Topics   Alcohol use: Yes    Alcohol/week: 1.0 standard drink of alcohol    Types: 1 Standard drinks or equivalent per week    Comment: 2 drinks/month   Drug use: Yes    Types: Marijuana    Comment: marijuana -- every day use     Allergies   Patient has no known allergies.   Review of Systems Review of Systems  Eyes:  Negative for visual disturbance.  Gastrointestinal:  Negative for nausea and vomiting.  Musculoskeletal:  Negative for neck pain.  Skin:  Positive for wound.  Neurological:  Negative for dizziness, tremors, seizures, syncope, speech difficulty, weakness, numbness and headaches.  All other systems reviewed and are negative.    Physical Exam Triage Vital Signs ED Triage Vitals [10/11/22 1416]  Enc  Vitals Group     BP 129/78     Pulse Rate 65     Resp 18     Temp 98.1 F (36.7 C)     Temp Source Oral     SpO2 95 %     Weight      Height      Head Circumference      Peak Flow      Pain Score 4     Pain Loc      Pain Edu?      Excl. in GC?    No data found.  Updated Vital Signs BP 129/78 (BP Location: Left Arm)   Pulse 65   Temp 98.1 F (36.7 C) (Oral)   Resp 18   SpO2 95%   Visual Acuity Right Eye Distance:   Left Eye Distance:   Bilateral Distance:    Right Eye Near:   Left Eye Near:    Bilateral Near:     Physical Exam Vitals reviewed.  Constitutional:      Appearance: Normal appearance.  HENT:     Head: Normocephalic. Laceration present. No contusion, right periorbital erythema or left periorbital erythema.      Right Ear: No drainage.     Left Ear: No drainage.     Nose: Nose normal.  Eyes:     Extraocular Movements: Extraocular movements intact.     Conjunctiva/sclera: Conjunctivae normal.     Pupils: Pupils are equal, round, and reactive to light.  Cardiovascular:     Rate and Rhythm: Normal rate and regular rhythm.  Pulmonary:     Effort: Pulmonary effort is normal.     Breath sounds: Normal breath sounds.  Musculoskeletal:        General: Normal range of motion.     Cervical back: Full passive range of motion without pain, normal range of motion and neck supple.  Skin:    General: Skin is warm and dry.     Findings: Laceration present.  Neurological:     General: No focal deficit present.     Mental Status: He is alert and oriented to person, place, and time.     Cranial Nerves: No cranial nerve deficit.     Sensory: No sensory deficit.     Motor: No weakness.     Coordination: Coordination normal.     Gait: Gait normal.  Psychiatric:        Mood and Affect: Mood normal.  Behavior: Behavior normal.      UC Treatments / Results  Labs (all labs ordered are listed, but only abnormal results are displayed) Labs Reviewed -  No data to display  EKG   Radiology No results found.  Procedures Laceration Repair  Date/Time: 10/11/2022 2:58 PM  Performed by: Lurline Idol, FNP Authorized by: Lurline Idol, FNP   Consent:    Consent obtained:  Verbal   Consent given by:  Patient   Risks, benefits, and alternatives were discussed: yes     Risks discussed:  Infection, pain, poor cosmetic result and poor wound healing Universal protocol:    Patient identity confirmed:  Verbally with patient and arm band Laceration details:    Length (cm):  3 Pre-procedure details:    Preparation:  Patient was prepped and draped in usual sterile fashion Exploration:    Imaging outcome: foreign body not noted     Wound exploration: wound explored through full range of motion and entire depth of wound visualized     Contaminated: no   Treatment:    Area cleansed with:  Saline   Amount of cleaning:  Standard Skin repair:    Repair method:  Sutures   Suture size:  3-0   Suture material:  Prolene   Suture technique:  Simple interrupted   Number of sutures:  5 Approximation:    Approximation:  Close Repair type:    Repair type:  Simple Post-procedure details:    Dressing:  Non-adherent dressing   Procedure completion:  Tolerated well, no immediate complications  (including critical care time)  Medications Ordered in UC Medications  Tdap (BOOSTRIX) injection 0.5 mL (0.5 mLs Intramuscular Given 10/11/22 1446)    Initial Impression / Assessment and Plan / UC Course  I have reviewed the triage vital signs and the nursing notes.  Pertinent labs & imaging results that were available during my care of the patient were reviewed by me and considered in my medical decision making (see chart for details).   42 yo male presenting with forehead laceration after head injury. No LOC. No neck pain, headache, dizziness, blurred vision, nausea or vomiting.  She is alert and oriented.  Vital signs stable.  No focal deficits  noted.  Wound repaired as above.  Tetanus updated.  Patient to return to clinic in 1 week to get sutures removed.  Wound care and indications for sooner follow-up discussed.  Today's evaluation has revealed no signs of a dangerous process. Discussed diagnosis with patient and/or guardian. Patient and/or guardian aware of their diagnosis, possible red flag symptoms to watch out for and need for close follow up. Patient and/or guardian understands verbal and written discharge instructions. Patient and/or guardian comfortable with plan and disposition.  Patient and/or guardian has a clear mental status at this time, good insight into illness (after discussion and teaching) and has clear judgment to make decisions regarding their care  Documentation was completed with the aid of voice recognition software. Transcription may contain typographical errors. Final Clinical Impressions(s) / UC Diagnoses   Final diagnoses:  Laceration of forehead, initial encounter  Injury of head, initial encounter     Discharge Instructions      You have been seen today for a laceration and head injury.   Keep your wound clean and dry. Keep the wound fully dry for the first 24 hours Do not scratch or pick at your wound. Clean the wound once a day. Wash your hands with soap and water for at least  20 seconds before and after touching your wound or changing your bandage Put a thin layer of antibiotic ointment and cover with a nonstick bandage  Go to the ED immediately if:  You have sudden: Headache that is very bad. Vomiting that does not stop. Changes in the size of one of your pupils. Pupils are the black centers of your eyes. Changes in how you see (vision). More confusion or more grumpy moods. You have a seizure. You have a clear or bloody fluid coming from your nose or ears.    ED Prescriptions   None    PDMP not reviewed this encounter.   Lurline Idol, [place] 10/11/22 1501

## 2022-10-11 NOTE — Discharge Instructions (Addendum)
You have been seen today for a laceration and head injury.   Keep your wound clean and dry. Keep the wound fully dry for the first 24 hours Do not scratch or pick at your wound. Clean the wound once a day. Wash your hands with soap and water for at least 20 seconds before and after touching your wound or changing your bandage Put a thin layer of antibiotic ointment and cover with a nonstick bandage  Go to the ED immediately if:  You have sudden: Headache that is very bad. Vomiting that does not stop. Changes in the size of one of your pupils. Pupils are the black centers of your eyes. Changes in how you see (vision). More confusion or more grumpy moods. You have a seizure. You have a clear or bloody fluid coming from your nose or ears.

## 2022-10-11 NOTE — ED Triage Notes (Signed)
Pt here for laceration to forehead today from tree limb; no LOC and bleeding controlled with bandage
